# Patient Record
Sex: Female | Born: 1970
Health system: Southern US, Community
[De-identification: ages and names within clinical notes are randomized; demographics above are authoritative.]

## PROBLEM LIST (undated history)

## (undated) DIAGNOSIS — Z8781 Personal history of (healed) traumatic fracture: Secondary | ICD-10-CM

## (undated) DIAGNOSIS — K219 Gastro-esophageal reflux disease without esophagitis: Secondary | ICD-10-CM

## (undated) DIAGNOSIS — E039 Hypothyroidism, unspecified: Secondary | ICD-10-CM

## (undated) DIAGNOSIS — Z8619 Personal history of other infectious and parasitic diseases: Secondary | ICD-10-CM

## (undated) DIAGNOSIS — M169 Osteoarthritis of hip, unspecified: Secondary | ICD-10-CM

## (undated) HISTORY — PX: CHOLECYSTECTOMY: SHX55

---

## 2007-09-04 HISTORY — PX: KNEE ARTHROSCOPY: SUR90

## 2014-09-03 HISTORY — PX: LAPAROSCOPIC GASTRIC SLEEVE RESECTION: SHX5895

## 2016-07-05 DIAGNOSIS — M25551 Pain in right hip: Secondary | ICD-10-CM | POA: Diagnosis not present

## 2016-07-05 DIAGNOSIS — M1611 Unilateral primary osteoarthritis, right hip: Secondary | ICD-10-CM | POA: Diagnosis not present

## 2016-07-12 DIAGNOSIS — M25551 Pain in right hip: Secondary | ICD-10-CM | POA: Diagnosis not present

## 2016-08-01 DIAGNOSIS — M169 Osteoarthritis of hip, unspecified: Secondary | ICD-10-CM | POA: Diagnosis not present

## 2016-08-01 DIAGNOSIS — Z903 Acquired absence of stomach [part of]: Secondary | ICD-10-CM | POA: Diagnosis not present

## 2016-08-01 DIAGNOSIS — Z713 Dietary counseling and surveillance: Secondary | ICD-10-CM | POA: Diagnosis not present

## 2016-08-01 DIAGNOSIS — Z6834 Body mass index (BMI) 34.0-34.9, adult: Secondary | ICD-10-CM | POA: Diagnosis not present

## 2016-08-01 DIAGNOSIS — E669 Obesity, unspecified: Secondary | ICD-10-CM | POA: Diagnosis not present

## 2016-08-16 DIAGNOSIS — M1611 Unilateral primary osteoarthritis, right hip: Secondary | ICD-10-CM | POA: Diagnosis not present

## 2016-08-16 DIAGNOSIS — M25551 Pain in right hip: Secondary | ICD-10-CM | POA: Diagnosis not present

## 2016-10-17 DIAGNOSIS — M1611 Unilateral primary osteoarthritis, right hip: Secondary | ICD-10-CM | POA: Diagnosis not present

## 2016-10-24 DIAGNOSIS — E039 Hypothyroidism, unspecified: Secondary | ICD-10-CM | POA: Diagnosis not present

## 2017-01-02 DIAGNOSIS — L0292 Furuncle, unspecified: Secondary | ICD-10-CM | POA: Diagnosis not present

## 2017-01-02 DIAGNOSIS — Z01419 Encounter for gynecological examination (general) (routine) without abnormal findings: Secondary | ICD-10-CM | POA: Diagnosis not present

## 2017-01-02 DIAGNOSIS — Z1231 Encounter for screening mammogram for malignant neoplasm of breast: Secondary | ICD-10-CM | POA: Diagnosis not present

## 2017-01-02 DIAGNOSIS — Z6835 Body mass index (BMI) 35.0-35.9, adult: Secondary | ICD-10-CM | POA: Diagnosis not present

## 2017-01-08 ENCOUNTER — Other Ambulatory Visit: Payer: Self-pay | Admitting: Obstetrics and Gynecology

## 2017-01-08 DIAGNOSIS — R928 Other abnormal and inconclusive findings on diagnostic imaging of breast: Secondary | ICD-10-CM

## 2017-01-11 ENCOUNTER — Ambulatory Visit
Admission: RE | Admit: 2017-01-11 | Discharge: 2017-01-11 | Disposition: A | Discharge status: Home / Self Care | Payer: BLUE CROSS/BLUE SHIELD | Source: Ambulatory Visit | Attending: Obstetrics and Gynecology | Admitting: Obstetrics and Gynecology

## 2017-01-11 ENCOUNTER — Other Ambulatory Visit: Payer: Self-pay | Admitting: Obstetrics and Gynecology

## 2017-01-11 DIAGNOSIS — R928 Other abnormal and inconclusive findings on diagnostic imaging of breast: Secondary | ICD-10-CM | POA: Diagnosis not present

## 2017-01-11 DIAGNOSIS — R922 Inconclusive mammogram: Secondary | ICD-10-CM | POA: Diagnosis not present

## 2017-01-15 ENCOUNTER — Ambulatory Visit
Admission: RE | Admit: 2017-01-15 | Discharge: 2017-01-15 | Disposition: A | Discharge status: Home / Self Care | Payer: PRIVATE HEALTH INSURANCE | Source: Ambulatory Visit | Attending: Obstetrics and Gynecology | Admitting: Obstetrics and Gynecology

## 2017-01-15 ENCOUNTER — Other Ambulatory Visit: Payer: Self-pay | Admitting: Obstetrics and Gynecology

## 2017-01-15 DIAGNOSIS — R928 Other abnormal and inconclusive findings on diagnostic imaging of breast: Secondary | ICD-10-CM

## 2017-01-17 DIAGNOSIS — M1611 Unilateral primary osteoarthritis, right hip: Secondary | ICD-10-CM | POA: Diagnosis not present

## 2017-01-24 DIAGNOSIS — S73191D Other sprain of right hip, subsequent encounter: Secondary | ICD-10-CM | POA: Diagnosis not present

## 2017-01-24 DIAGNOSIS — M25551 Pain in right hip: Secondary | ICD-10-CM | POA: Diagnosis not present

## 2017-02-26 DIAGNOSIS — R05 Cough: Secondary | ICD-10-CM | POA: Diagnosis not present

## 2017-02-26 DIAGNOSIS — J0140 Acute pansinusitis, unspecified: Secondary | ICD-10-CM | POA: Diagnosis not present

## 2017-03-11 DIAGNOSIS — M1631 Unilateral osteoarthritis resulting from hip dysplasia, right hip: Secondary | ICD-10-CM | POA: Diagnosis not present

## 2017-04-25 DIAGNOSIS — M1611 Unilateral primary osteoarthritis, right hip: Secondary | ICD-10-CM | POA: Diagnosis not present

## 2017-06-13 DIAGNOSIS — M25551 Pain in right hip: Secondary | ICD-10-CM | POA: Diagnosis not present

## 2017-06-13 DIAGNOSIS — S73191D Other sprain of right hip, subsequent encounter: Secondary | ICD-10-CM | POA: Diagnosis not present

## 2017-06-13 DIAGNOSIS — M1611 Unilateral primary osteoarthritis, right hip: Secondary | ICD-10-CM | POA: Diagnosis not present

## 2017-07-10 DIAGNOSIS — Z23 Encounter for immunization: Secondary | ICD-10-CM | POA: Diagnosis not present

## 2017-07-10 DIAGNOSIS — Z0000 Encounter for general adult medical examination without abnormal findings: Secondary | ICD-10-CM | POA: Diagnosis not present

## 2017-07-10 DIAGNOSIS — E039 Hypothyroidism, unspecified: Secondary | ICD-10-CM | POA: Diagnosis not present

## 2017-07-10 DIAGNOSIS — M25551 Pain in right hip: Secondary | ICD-10-CM | POA: Diagnosis not present

## 2017-07-10 DIAGNOSIS — Z9884 Bariatric surgery status: Secondary | ICD-10-CM | POA: Diagnosis not present

## 2017-07-17 DIAGNOSIS — S73191D Other sprain of right hip, subsequent encounter: Secondary | ICD-10-CM | POA: Diagnosis not present

## 2017-07-17 DIAGNOSIS — M25551 Pain in right hip: Secondary | ICD-10-CM | POA: Diagnosis not present

## 2017-08-11 ENCOUNTER — Ambulatory Visit: Payer: Self-pay | Admitting: Orthopedic Surgery

## 2017-09-08 ENCOUNTER — Ambulatory Visit: Payer: Self-pay | Admitting: Orthopedic Surgery
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
## 2017-09-09 ENCOUNTER — Encounter (HOSPITAL_COMMUNITY): Payer: Self-pay

## 2017-09-09 NOTE — Pre-Procedure Instructions (Signed)
Surgical clearance and Last office visit note from Dr. Chales SalmonVaradarajan 07/10/17 in chart.

## 2017-09-09 NOTE — Patient Instructions (Signed)
Your procedure is scheduled on: Wednesday, Jan. 16, 2019   Surgery Time:  8:30AM-10:00AM   [facility]  [facility]    Report to admitting at 6:00 AM   Call this number if you have problems the morning of surgery [phone]   Do not eat food or drink liquids :After Midnight.   Do NOT smoke after Midnight   Take these medicines the morning of surgery with A SIP OF WATER: Esomeprazole, Levothyroxine                               You may not have any metal on your body including hair pins, jewelry, and body piercings             Do not wear make-up, lotions, powders, perfumes/cologne, or deodorant             Do not wear nail polish.  Do not shave  48 hours prior to surgery.      Do not bring valuables to the hospital. [facility] IS NOT             RESPONSIBLE   FOR VALUABLES.   Contacts, dentures or bridgework may not be worn into surgery.   Leave suitcase in the car. After surgery it may be brought to your room.   Special Instructions: Bring a copy of your healthcare power of attorney and living will documents         the day of surgery if you haven't scanned them in before.              Please read over the following fact sheets you were given:   [facility] - Preparing [facility] Before surgery, you can play an important role.  Because skin is not sterile, your skin needs to be as free of germs as possible.  You can reduce the number of germs on your skin by washing with CHG (chlorahexidine gluconate) soap before surgery.  CHG is an antiseptic cleaner which kills germs and bonds with the skin to continue killing germs even after washing. Please DO NOT use if you have an allergy to CHG or antibacterial soaps.  If your skin becomes reddened/irritated stop using the CHG and inform your nurse when you arrive at Short Stay. Do not shave (including legs and underarms) for at least 48 hours prior to the first CHG shower.  You may shave your  face/neck.  Please follow these instructions carefully:  1.  Shower with CHG Soap the night before surgery and the  morning of surgery.  2.  If you choose to wash your hair, wash your hair first as usual with your normal  shampoo.  3.  After you shampoo, rinse your hair and body thoroughly to remove the shampoo.                             4.  Use CHG as you would any other liquid soap.  You can apply chg directly to the skin and wash.  Gently with a scrungie or clean washcloth.  5.  Apply the CHG Soap to your body ONLY FROM THE NECK DOWN.   Do   not use on face/ open  Wound or open sores. Avoid contact with eyes, ears mouth and   genitals (private parts).                       Wash face,  Genitals (private parts) with your normal soap.             6.  Wash thoroughly, paying special attention to the area where your    surgery  will be performed.  7.  Thoroughly rinse your body with warm water from the neck down.  8.  DO NOT shower/wash with your normal soap after using and rinsing off the CHG Soap.                9.  Pat yourself dry with a clean towel.            10.  Wear clean pajamas.            11.  Place clean sheets on your bed the night of your first shower and do not  sleep with pets. Day of Surgery : Do not apply any lotions/deodorants the morning of surgery.  Please wear clean clothes to the hospital/surgery center.  FAILURE TO FOLLOW THESE INSTRUCTIONS MAY RESULT IN THE CANCELLATION OF YOUR SURGERY  PATIENT SIGNATURE_________________________________  NURSE SIGNATURE__________________________________  ________________________________________________________________________   Adam Phenix  An incentive spirometer is a tool that can help keep your lungs clear and active. This tool measures how well you are filling your lungs with each breath. Taking long deep breaths may help reverse or decrease the chance of developing breathing (pulmonary)  problems (especially infection) following:  A long period of time when you are unable to move or be active. BEFORE THE PROCEDURE   If the spirometer includes an indicator to show your best effort, your nurse or respiratory therapist will set it to a desired goal.  If possible, sit up straight or lean slightly forward. Try not to slouch.  Hold the incentive spirometer in an upright position. INSTRUCTIONS FOR USE  1. Sit on the edge of your bed if possible, or sit up as far as you can in bed or on a chair. 2. Hold the incentive spirometer in an upright position. 3. Breathe out normally. 4. Place the mouthpiece in your mouth and seal your lips tightly around it. 5. Breathe in slowly and as deeply as possible, raising the piston or the ball toward the top of the column. 6. Hold your breath for 3-5 seconds or for as long as possible. Allow the piston or ball to fall to the bottom of the column. 7. Remove the mouthpiece from your mouth and breathe out normally. 8. Rest for a few seconds and repeat Steps 1 through 7 at least 10 times every 1-2 hours when you are awake. Take your time and take a few normal breaths between deep breaths. 9. The spirometer may include an indicator to show your best effort. Use the indicator as a goal to work toward during each repetition. 10. After each set of 10 deep breaths, practice coughing to be sure your lungs are clear. If you have an incision (the cut made at the time of surgery), support your incision when coughing by placing a pillow or rolled up towels firmly against it. Once you are able to get out of bed, walk around indoors and cough well. You may stop using the incentive spirometer when instructed by your caregiver.  RISKS AND COMPLICATIONS  Take your time  so you do not get dizzy or light-headed.  If you are in pain, you may need to take or ask for pain medication before doing incentive spirometry. It is harder to take a deep breath if you are having  pain. AFTER USE  Rest and breathe slowly and easily.  It can be helpful to keep track of a log of your progress. Your caregiver can provide you with a simple table to help with this. If you are using the spirometer at home, follow these instructions: [place] IF:   You are having difficultly using the spirometer.  You have trouble using the spirometer as often as instructed.  Your pain medication is not giving enough relief while using the spirometer.  You develop fever of 100.5 F (38.1 C) or higher. SEEK IMMEDIATE MEDICAL CARE IF:   You cough up bloody sputum that had not been present before.  You develop fever of 102 F (38.9 C) or greater.  You develop worsening pain at or near the incision site. MAKE SURE YOU:   Understand these instructions.  Will watch your condition.  Will get help right away if you are not doing well or get worse. Document Released: 12/31/2006 Document Revised: 11/12/2011 Document Reviewed: 03/03/2007 ExitCare Patient Information 2014 ExitCare, [place].   ________________________________________________________________________  WHAT IS A BLOOD TRANSFUSION? Blood Transfusion Information  A transfusion is the replacement of blood or some of its parts. Blood is made up of multiple cells which provide different functions.  Red blood cells carry oxygen and are used for blood loss replacement.  White blood cells fight against infection.  Platelets control bleeding.  Plasma helps clot blood.  Other blood products are available for specialized needs, such as hemophilia or other clotting disorders. BEFORE THE TRANSFUSION  Who gives blood for transfusions?   Healthy volunteers who are fully evaluated to make sure their blood is safe. This is blood bank blood. Transfusion therapy is the safest it has ever been in the practice of medicine. Before blood is taken from a donor, a complete history is taken to make sure that person has no history  of diseases nor engages in risky social behavior (examples are intravenous drug use or sexual activity with multiple partners). The donor's travel history is screened to minimize risk of transmitting infections, such as malaria. The donated blood is tested for signs of infectious diseases, such as HIV and hepatitis. The blood is then tested to be sure it is compatible with you in order to minimize the chance of a transfusion reaction. If you or a relative donates blood, this is often done in anticipation of surgery and is not appropriate for emergency situations. It takes many days to process the donated blood. RISKS AND COMPLICATIONS Although transfusion therapy is very safe and saves many lives, the main dangers of transfusion include:   Getting an infectious disease.  Developing a transfusion reaction. This is an allergic reaction to something in the blood you were given. Every precaution is taken to prevent this. The decision to have a blood transfusion has been considered carefully by your caregiver before blood is given. Blood is not given unless the benefits outweigh the risks. AFTER THE TRANSFUSION  Right after receiving a blood transfusion, you will usually feel much better and more energetic. This is especially true if your red blood cells have gotten low (anemic). The transfusion raises the level of the red blood cells which carry oxygen, and this usually causes an energy increase.  The  nurse administering the transfusion will monitor you carefully for complications. HOME CARE INSTRUCTIONS  No special instructions are needed after a transfusion. You may find your energy is better. Speak with your caregiver about any limitations on activity for underlying diseases you may have. SEEK MEDICAL CARE IF:   Your condition is not improving after your transfusion.  You develop redness or irritation at the intravenous (IV) site. SEEK IMMEDIATE MEDICAL CARE IF:  Any of the following symptoms  occur over the next 12 hours:  Shaking chills.  You have a temperature by mouth above 102 F (38.9 C), not controlled by medicine.  Chest, back, or muscle pain.  People around you feel you are not acting correctly or are confused.  Shortness of breath or difficulty breathing.  Dizziness and fainting.  You get a rash or develop hives.  You have a decrease in urine output.  Your urine turns a dark color or changes to pink, red, or brown. Any of the following symptoms occur over the next 10 days:  You have a temperature by mouth above 102 F (38.9 C), not controlled by medicine.  Shortness of breath.  Weakness after normal activity.  The white part of the eye turns yellow (jaundice).  You have a decrease in the amount of urine or are urinating less often.  Your urine turns a dark color or changes to pink, red, or brown. Document Released: 08/17/2000 Document Revised: 11/12/2011 Document Reviewed: 04/05/2008 [facility] Patient Information 2014 [place], [place].  _______________________________________________________________________

## 2017-09-11 ENCOUNTER — Encounter (HOSPITAL_COMMUNITY): Payer: Self-pay

## 2017-09-11 ENCOUNTER — Other Ambulatory Visit: Payer: Self-pay

## 2017-09-11 ENCOUNTER — Encounter (INDEPENDENT_AMBULATORY_CARE_PROVIDER_SITE_OTHER): Payer: Self-pay

## 2017-09-11 ENCOUNTER — Encounter (HOSPITAL_COMMUNITY)
Admission: RE | Admit: 2017-09-11 | Discharge: 2017-09-11 | Disposition: A | Discharge status: Home / Self Care | Payer: BLUE CROSS/BLUE SHIELD | Source: Ambulatory Visit | Attending: Orthopedic Surgery | Admitting: Orthopedic Surgery

## 2017-09-11 DIAGNOSIS — Z01812 Encounter for preprocedural laboratory examination: Secondary | ICD-10-CM | POA: Diagnosis not present

## 2017-09-11 DIAGNOSIS — M1611 Unilateral primary osteoarthritis, right hip: Secondary | ICD-10-CM | POA: Insufficient documentation

## 2017-09-11 HISTORY — DX: Gastro-esophageal reflux disease without esophagitis: K21.9

## 2017-09-11 HISTORY — DX: Personal history of other infectious and parasitic diseases: Z86.19

## 2017-09-11 HISTORY — DX: Personal history of (healed) traumatic fracture: Z87.81

## 2017-09-11 HISTORY — DX: Osteoarthritis of hip, unspecified: M16.9

## 2017-09-11 HISTORY — DX: Hypothyroidism, unspecified: E03.9

## 2017-09-11 LAB — ABO/RH: ABO/RH(D): O POS

## 2017-09-11 LAB — PROTIME-INR
INR: 1.02
Prothrombin Time: 13.3 seconds (ref 11.4–15.2)

## 2017-09-11 LAB — COMPREHENSIVE METABOLIC PANEL
ALT: 21 U/L (ref 14–54)
AST: 21 U/L (ref 15–41)
Albumin: 4 g/dL (ref 3.5–5.0)
Alkaline Phosphatase: 54 U/L (ref 38–126)
Anion gap: 3 — ABNORMAL LOW (ref 5–15)
BUN: 27 mg/dL — AB (ref 6–20)
CHLORIDE: 106 mmol/L (ref 101–111)
CO2: 27 mmol/L (ref 22–32)
CREATININE: 0.68 mg/dL (ref 0.44–1.00)
Calcium: 8.7 mg/dL — ABNORMAL LOW (ref 8.9–10.3)
GFR calc Af Amer: >60 mL/min (ref >60)
Glucose, Bld: 88 mg/dL (ref 65–99)
Potassium: 4.2 mmol/L (ref 3.5–5.1)
Sodium: 136 mmol/L (ref 135–145)
Total Bilirubin: 0.9 mg/dL (ref 0.3–1.2)
Total Protein: 6.8 g/dL (ref 6.5–8.1)

## 2017-09-11 LAB — CBC
HEMATOCRIT: 39 % (ref 36.0–46.0)
Hemoglobin: 12.9 g/dL (ref 12.0–15.0)
MCH: 31.5 pg (ref 26.0–34.0)
MCHC: 33.1 g/dL (ref 30.0–36.0)
MCV: 95.1 fL (ref 78.0–100.0)
Platelets: 179 10*3/uL (ref 150–400)
RBC: 4.1 MIL/uL (ref 3.87–5.11)
RDW: 12.4 % (ref 11.5–15.5)
WBC: 6.5 10*3/uL (ref 4.0–10.5)

## 2017-09-11 LAB — SURGICAL PCR SCREEN
MRSA, PCR: NEGATIVE
STAPHYLOCOCCUS AUREUS: NEGATIVE

## 2017-09-11 LAB — APTT: aPTT: 31 seconds (ref 24–36)

## 2017-09-12 NOTE — Pre-Procedure Instructions (Signed)
CMP results 09/11/17 faxed to Dr. Aluisio via epic. 

## 2017-09-17 MED ORDER — SODIUM CHLORIDE 0.9 % IV SOLN
1000.0000 mg | INTRAVENOUS | Status: AC
  Administered 2017-09-18: 1000 mg via INTRAVENOUS
  Filled 2017-09-17: qty 1100

## 2017-09-17 NOTE — Anesthesia Preprocedure Evaluation (Signed)
Anesthesia Evaluation  Patient identified by MRN, date of birth, ID band Patient awake    Reviewed: Allergy & Precautions, NPO status , Patient's Chart, lab work & pertinent test results  Airway Mallampati: II  TM Distance: >3 FB Neck ROM: Full    Dental no notable dental hx.    Pulmonary neg pulmonary ROS,    Pulmonary exam normal breath sounds clear to auscultation       Cardiovascular negative cardio ROS Normal cardiovascular exam Rhythm:Regular Rate:Normal     Neuro/Psych negative neurological ROS  negative psych ROS   GI/Hepatic negative GI ROS, Neg liver ROS, GERD  ,  Endo/Other  negative endocrine ROSHypothyroidism   Renal/GU negative Renal ROS  negative genitourinary   Musculoskeletal negative musculoskeletal ROS (+) Arthritis , Osteoarthritis,    Abdominal   Peds negative pediatric ROS (+)  Hematology negative hematology ROS (+)   Anesthesia Other Findings   Reproductive/Obstetrics negative OB ROS                             Anesthesia Physical Anesthesia Plan  ASA: II  Anesthesia Plan: Spinal   Post-op Pain Management:    Induction:   PONV Risk Score and Plan: 2 and Propofol infusion, Ondansetron and Treatment may vary due to age or medical condition  Airway Management Planned: Nasal Cannula and Natural Airway  Additional Equipment:   Intra-op Plan:   Post-operative Plan:   Informed Consent: I have reviewed the patients History and Physical, chart, labs and discussed the procedure including the risks, benefits and alternatives for the proposed anesthesia with the patient or authorized representative who has indicated his/her understanding and acceptance.     Plan Discussed with: CRNA and Anesthesiologist  Anesthesia Plan Comments: (  )        Anesthesia Quick Evaluation

## 2017-09-18 ENCOUNTER — Inpatient Hospital Stay (HOSPITAL_COMMUNITY)
Admission: RE | Admit: 2017-09-18 | Discharge: 2017-09-20 | DRG: 470 | Disposition: A | Discharge status: Home Health | Payer: BLUE CROSS/BLUE SHIELD | Source: Ambulatory Visit | Attending: Orthopedic Surgery | Admitting: Orthopedic Surgery

## 2017-09-18 ENCOUNTER — Inpatient Hospital Stay (HOSPITAL_COMMUNITY): Payer: BLUE CROSS/BLUE SHIELD

## 2017-09-18 ENCOUNTER — Inpatient Hospital Stay (HOSPITAL_COMMUNITY): Payer: BLUE CROSS/BLUE SHIELD | Admitting: Anesthesiology

## 2017-09-18 ENCOUNTER — Encounter (HOSPITAL_COMMUNITY)
Admission: RE | Disposition: A | Discharge status: Home Health | Payer: Self-pay | Source: Ambulatory Visit | Attending: Orthopedic Surgery

## 2017-09-18 ENCOUNTER — Other Ambulatory Visit: Payer: Self-pay

## 2017-09-18 ENCOUNTER — Encounter (HOSPITAL_COMMUNITY): Payer: Self-pay | Admitting: Emergency Medicine

## 2017-09-18 DIAGNOSIS — M1611 Unilateral primary osteoarthritis, right hip: Secondary | ICD-10-CM | POA: Diagnosis not present

## 2017-09-18 DIAGNOSIS — Z79890 Hormone replacement therapy (postmenopausal): Secondary | ICD-10-CM

## 2017-09-18 DIAGNOSIS — M25751 Osteophyte, right hip: Secondary | ICD-10-CM | POA: Diagnosis present

## 2017-09-18 DIAGNOSIS — Z96649 Presence of unspecified artificial hip joint: Secondary | ICD-10-CM

## 2017-09-18 DIAGNOSIS — Z79899 Other long term (current) drug therapy: Secondary | ICD-10-CM

## 2017-09-18 DIAGNOSIS — E669 Obesity, unspecified: Secondary | ICD-10-CM | POA: Diagnosis present

## 2017-09-18 DIAGNOSIS — Z820 Family history of epilepsy and other diseases of the nervous system: Secondary | ICD-10-CM | POA: Diagnosis not present

## 2017-09-18 DIAGNOSIS — Z808 Family history of malignant neoplasm of other organs or systems: Secondary | ICD-10-CM | POA: Diagnosis not present

## 2017-09-18 DIAGNOSIS — Z96641 Presence of right artificial hip joint: Secondary | ICD-10-CM | POA: Diagnosis not present

## 2017-09-18 DIAGNOSIS — Z87891 Personal history of nicotine dependence: Secondary | ICD-10-CM

## 2017-09-18 DIAGNOSIS — E039 Hypothyroidism, unspecified: Secondary | ICD-10-CM | POA: Diagnosis present

## 2017-09-18 DIAGNOSIS — Z9884 Bariatric surgery status: Secondary | ICD-10-CM | POA: Diagnosis not present

## 2017-09-18 DIAGNOSIS — Z791 Long term (current) use of non-steroidal anti-inflammatories (NSAID): Secondary | ICD-10-CM | POA: Diagnosis not present

## 2017-09-18 DIAGNOSIS — K219 Gastro-esophageal reflux disease without esophagitis: Secondary | ICD-10-CM | POA: Diagnosis present

## 2017-09-18 DIAGNOSIS — M169 Osteoarthritis of hip, unspecified: Secondary | ICD-10-CM | POA: Diagnosis present

## 2017-09-18 DIAGNOSIS — Z9049 Acquired absence of other specified parts of digestive tract: Secondary | ICD-10-CM

## 2017-09-18 DIAGNOSIS — Z6836 Body mass index (BMI) 36.0-36.9, adult: Secondary | ICD-10-CM | POA: Diagnosis not present

## 2017-09-18 DIAGNOSIS — Z471 Aftercare following joint replacement surgery: Secondary | ICD-10-CM | POA: Diagnosis not present

## 2017-09-18 HISTORY — PX: TOTAL HIP ARTHROPLASTY: SHX124

## 2017-09-18 LAB — TYPE AND SCREEN
ABO/RH(D): O POS
Antibody Screen: NEGATIVE

## 2017-09-18 SURGERY — ARTHROPLASTY, HIP, TOTAL, ANTERIOR APPROACH
Anesthesia: Spinal | Site: Hip | Laterality: Right

## 2017-09-18 MED ORDER — ACETAMINOPHEN 325 MG PO TABS: 325.0000 mg | ORAL_TABLET | ORAL | Status: DC | PRN

## 2017-09-18 MED ORDER — MENTHOL 3 MG MT LOZG: 1.0000 | LOZENGE | OROMUCOSAL | Status: DC | PRN

## 2017-09-18 MED ORDER — CHLORHEXIDINE GLUCONATE 4 % EX LIQD: 60.0000 mL | Freq: Once | CUTANEOUS | Status: DC

## 2017-09-18 MED ORDER — LEVOTHYROXINE SODIUM 25 MCG PO TABS
25.0000 ug | ORAL_TABLET | Freq: Every day | ORAL | Status: DC
  Administered 2017-09-19 – 2017-09-20 (×3): 25 ug via ORAL
  Filled 2017-09-18 (×2): qty 1

## 2017-09-18 MED ORDER — TRANEXAMIC ACID 1000 MG/10ML IV SOLN
1000.0000 mg | Freq: Once | INTRAVENOUS | Status: AC
  Administered 2017-09-18: 1000 mg via INTRAVENOUS
  Filled 2017-09-18: qty 1100

## 2017-09-18 MED ORDER — FLEET ENEMA 7-19 GM/118ML RE ENEM: 1.0000 | ENEMA | Freq: Once | RECTAL | Status: DC | PRN

## 2017-09-18 MED ORDER — CEFAZOLIN SODIUM-DEXTROSE 2-4 GM/100ML-% IV SOLN
2.0000 g | INTRAVENOUS | Status: AC
  Administered 2017-09-18: 2 g via INTRAVENOUS
  Filled 2017-09-18: qty 100

## 2017-09-18 MED ORDER — METHOCARBAMOL 500 MG PO TABS
500.0000 mg | ORAL_TABLET | Freq: Four times a day (QID) | ORAL | Status: DC | PRN
  Administered 2017-09-19 – 2017-09-20 (×3): 500 mg via ORAL
  Filled 2017-09-18 (×3): qty 1

## 2017-09-18 MED ORDER — MEPERIDINE HCL 50 MG/ML IJ SOLN: 6.2500 mg | INTRAMUSCULAR | Status: DC | PRN

## 2017-09-18 MED ORDER — DEXAMETHASONE SODIUM PHOSPHATE 10 MG/ML IJ SOLN
INTRAMUSCULAR | Status: AC
  Filled 2017-09-18: qty 1

## 2017-09-18 MED ORDER — 0.9 % SODIUM CHLORIDE (POUR BTL) OPTIME
TOPICAL | Status: DC | PRN
  Administered 2017-09-18: 1000 mL

## 2017-09-18 MED ORDER — OXYCODONE HCL 5 MG PO TABS: 5.0000 mg | ORAL_TABLET | Freq: Once | ORAL | Status: DC | PRN

## 2017-09-18 MED ORDER — TRAMADOL HCL 50 MG PO TABS: 50.0000 mg | ORAL_TABLET | Freq: Four times a day (QID) | ORAL | Status: DC | PRN

## 2017-09-18 MED ORDER — GLYCOPYRROLATE 0.2 MG/ML IV SOSY
PREFILLED_SYRINGE | INTRAVENOUS | Status: AC
  Filled 2017-09-18: qty 5

## 2017-09-18 MED ORDER — EPHEDRINE SULFATE-NACL 50-0.9 MG/10ML-% IV SOSY
PREFILLED_SYRINGE | INTRAVENOUS | Status: DC | PRN
  Administered 2017-09-18: 10 mg via INTRAVENOUS
  Administered 2017-09-18: 20 mg via INTRAVENOUS
  Administered 2017-09-18: 10 mg via INTRAVENOUS
  Administered 2017-09-18: 5 mg via INTRAVENOUS

## 2017-09-18 MED ORDER — METHOCARBAMOL 1000 MG/10ML IJ SOLN
500.0000 mg | Freq: Four times a day (QID) | INTRAVENOUS | Status: DC | PRN
  Administered 2017-09-18 (×2): 500 mg via INTRAVENOUS
  Filled 2017-09-18 (×2): qty 550

## 2017-09-18 MED ORDER — ACETAMINOPHEN 160 MG/5ML PO SOLN: 325.0000 mg | ORAL | Status: DC | PRN

## 2017-09-18 MED ORDER — PROPOFOL 500 MG/50ML IV EMUL
INTRAVENOUS | Status: DC | PRN
  Administered 2017-09-18: 75 ug/kg/min via INTRAVENOUS

## 2017-09-18 MED ORDER — DEXAMETHASONE SODIUM PHOSPHATE 10 MG/ML IJ SOLN
10.0000 mg | Freq: Once | INTRAMUSCULAR | Status: AC
  Administered 2017-09-19: 10 mg via INTRAVENOUS
  Filled 2017-09-18: qty 1

## 2017-09-18 MED ORDER — EPHEDRINE 5 MG/ML INJ
INTRAVENOUS | Status: AC
  Filled 2017-09-18: qty 10

## 2017-09-18 MED ORDER — MIDAZOLAM HCL 5 MG/5ML IJ SOLN
INTRAMUSCULAR | Status: DC | PRN
  Administered 2017-09-18: 2 mg via INTRAVENOUS

## 2017-09-18 MED ORDER — CEFAZOLIN SODIUM-DEXTROSE 2-4 GM/100ML-% IV SOLN
2.0000 g | Freq: Four times a day (QID) | INTRAVENOUS | Status: AC
  Administered 2017-09-18 (×2): 2 g via INTRAVENOUS
  Filled 2017-09-18 (×2): qty 100

## 2017-09-18 MED ORDER — PROPOFOL 10 MG/ML IV BOLUS
INTRAVENOUS | Status: DC | PRN
  Administered 2017-09-18: 20 mg via INTRAVENOUS
  Administered 2017-09-18: 10 mg via INTRAVENOUS
  Administered 2017-09-18: 20 mg via INTRAVENOUS

## 2017-09-18 MED ORDER — OXYCODONE HCL 5 MG PO TABS: 5.0000 mg | ORAL_TABLET | ORAL | Status: DC | PRN

## 2017-09-18 MED ORDER — STERILE WATER FOR IRRIGATION IR SOLN
Status: DC | PRN
  Administered 2017-09-18: 2000 mL

## 2017-09-18 MED ORDER — PROPOFOL 10 MG/ML IV BOLUS
INTRAVENOUS | Status: AC
  Filled 2017-09-18: qty 20

## 2017-09-18 MED ORDER — ONDANSETRON HCL 4 MG/2ML IJ SOLN
INTRAMUSCULAR | Status: DC | PRN
  Administered 2017-09-18: 4 mg via INTRAVENOUS

## 2017-09-18 MED ORDER — POLYETHYLENE GLYCOL 3350 17 G PO PACK
17.0000 g | PACK | Freq: Every day | ORAL | Status: DC | PRN
  Administered 2017-09-18 – 2017-09-19 (×2): 17 g via ORAL
  Filled 2017-09-18 (×2): qty 1

## 2017-09-18 MED ORDER — ACETAMINOPHEN 10 MG/ML IV SOLN
1000.0000 mg | Freq: Once | INTRAVENOUS | Status: AC
  Administered 2017-09-18: 1000 mg via INTRAVENOUS
  Filled 2017-09-18: qty 100

## 2017-09-18 MED ORDER — KETOROLAC TROMETHAMINE 30 MG/ML IJ SOLN: 30.0000 mg | Freq: Once | INTRAMUSCULAR | Status: DC | PRN

## 2017-09-18 MED ORDER — PROPOFOL 10 MG/ML IV BOLUS
INTRAVENOUS | Status: AC
  Filled 2017-09-18: qty 60

## 2017-09-18 MED ORDER — MIDAZOLAM HCL 2 MG/2ML IJ SOLN
INTRAMUSCULAR | Status: AC
  Filled 2017-09-18: qty 2

## 2017-09-18 MED ORDER — RIVAROXABAN 10 MG PO TABS
10.0000 mg | ORAL_TABLET | Freq: Every day | ORAL | Status: DC
  Administered 2017-09-19 – 2017-09-20 (×2): 10 mg via ORAL
  Filled 2017-09-18 (×2): qty 1

## 2017-09-18 MED ORDER — PHENOL 1.4 % MT LIQD: 1.0000 | OROMUCOSAL | Status: DC | PRN

## 2017-09-18 MED ORDER — PANTOPRAZOLE SODIUM 40 MG PO TBEC
80.0000 mg | DELAYED_RELEASE_TABLET | Freq: Every day | ORAL | Status: DC
  Administered 2017-09-19 – 2017-09-20 (×2): 80 mg via ORAL
  Filled 2017-09-18 (×2): qty 2

## 2017-09-18 MED ORDER — OXYCODONE HCL 5 MG PO TABS
10.0000 mg | ORAL_TABLET | ORAL | Status: DC | PRN
  Administered 2017-09-18 – 2017-09-20 (×12): 10 mg via ORAL
  Filled 2017-09-18 (×12): qty 2

## 2017-09-18 MED ORDER — DOCUSATE SODIUM 100 MG PO CAPS
100.0000 mg | ORAL_CAPSULE | Freq: Two times a day (BID) | ORAL | Status: DC
  Administered 2017-09-18 – 2017-09-20 (×4): 100 mg via ORAL
  Filled 2017-09-18 (×4): qty 1

## 2017-09-18 MED ORDER — DM-GUAIFENESIN ER 30-600 MG PO TB12: 1.0000 | ORAL_TABLET | Freq: Two times a day (BID) | ORAL | Status: DC | PRN

## 2017-09-18 MED ORDER — BUPIVACAINE HCL (PF) 0.25 % IJ SOLN
INTRAMUSCULAR | Status: DC | PRN
  Administered 2017-09-18: 30 mL

## 2017-09-18 MED ORDER — DEXAMETHASONE SODIUM PHOSPHATE 10 MG/ML IJ SOLN
10.0000 mg | Freq: Once | INTRAMUSCULAR | Status: AC
  Administered 2017-09-18: 10 mg via INTRAVENOUS

## 2017-09-18 MED ORDER — DIPHENHYDRAMINE HCL 12.5 MG/5ML PO ELIX: 12.5000 mg | ORAL_SOLUTION | ORAL | Status: DC | PRN

## 2017-09-18 MED ORDER — ACETAMINOPHEN 500 MG PO TABS
1000.0000 mg | ORAL_TABLET | Freq: Four times a day (QID) | ORAL | Status: AC
  Administered 2017-09-18 – 2017-09-19 (×4): 1000 mg via ORAL
  Filled 2017-09-18 (×4): qty 2

## 2017-09-18 MED ORDER — FENTANYL CITRATE (PF) 100 MCG/2ML IJ SOLN
INTRAMUSCULAR | Status: DC | PRN
  Administered 2017-09-18 (×2): 50 ug via INTRAVENOUS

## 2017-09-18 MED ORDER — FENTANYL CITRATE (PF) 100 MCG/2ML IJ SOLN
INTRAMUSCULAR | Status: AC
  Administered 2017-09-18: 25 ug via INTRAVENOUS
  Filled 2017-09-18: qty 2

## 2017-09-18 MED ORDER — LACTATED RINGERS IV SOLN
INTRAVENOUS | Status: DC
  Administered 2017-09-18 (×3): via INTRAVENOUS

## 2017-09-18 MED ORDER — ONDANSETRON HCL 4 MG/2ML IJ SOLN
4.0000 mg | Freq: Four times a day (QID) | INTRAMUSCULAR | Status: DC | PRN
  Administered 2017-09-19: 4 mg via INTRAVENOUS
  Filled 2017-09-18: qty 2

## 2017-09-18 MED ORDER — SODIUM CHLORIDE 0.9 % IV SOLN
INTRAVENOUS | Status: DC
  Administered 2017-09-18: 18:00:00 via INTRAVENOUS

## 2017-09-18 MED ORDER — FENTANYL CITRATE (PF) 100 MCG/2ML IJ SOLN
INTRAMUSCULAR | Status: AC
  Filled 2017-09-18: qty 2

## 2017-09-18 MED ORDER — FENTANYL CITRATE (PF) 100 MCG/2ML IJ SOLN
25.0000 ug | INTRAMUSCULAR | Status: DC | PRN
  Administered 2017-09-18 (×2): 25 ug via INTRAVENOUS
  Administered 2017-09-18: 50 ug via INTRAVENOUS

## 2017-09-18 MED ORDER — ACETAMINOPHEN 325 MG PO TABS: 650.0000 mg | ORAL_TABLET | ORAL | Status: DC | PRN

## 2017-09-18 MED ORDER — PHENYLEPHRINE HCL 10 MG/ML IJ SOLN
INTRAMUSCULAR | Status: AC
  Filled 2017-09-18: qty 1

## 2017-09-18 MED ORDER — BISACODYL 10 MG RE SUPP: 10.0000 mg | Freq: Every day | RECTAL | Status: DC | PRN

## 2017-09-18 MED ORDER — BUPIVACAINE HCL (PF) 0.25 % IJ SOLN
INTRAMUSCULAR | Status: AC
  Filled 2017-09-18: qty 30

## 2017-09-18 MED ORDER — METOCLOPRAMIDE HCL 5 MG/ML IJ SOLN
5.0000 mg | Freq: Three times a day (TID) | INTRAMUSCULAR | Status: DC | PRN
  Administered 2017-09-19: 10 mg via INTRAVENOUS
  Filled 2017-09-18: qty 2

## 2017-09-18 MED ORDER — GLYCOPYRROLATE 0.2 MG/ML IV SOSY
PREFILLED_SYRINGE | INTRAVENOUS | Status: DC | PRN
  Administered 2017-09-18: .2 mg via INTRAVENOUS

## 2017-09-18 MED ORDER — ONDANSETRON HCL 4 MG/2ML IJ SOLN
INTRAMUSCULAR | Status: AC
  Filled 2017-09-18: qty 2

## 2017-09-18 MED ORDER — OXYCODONE HCL 5 MG/5ML PO SOLN
5.0000 mg | Freq: Once | ORAL | Status: DC | PRN
  Filled 2017-09-18: qty 5

## 2017-09-18 MED ORDER — MORPHINE SULFATE (PF) 2 MG/ML IV SOLN
1.0000 mg | INTRAVENOUS | Status: DC | PRN
  Administered 2017-09-18 (×2): 1 mg via INTRAVENOUS
  Filled 2017-09-18 (×2): qty 1

## 2017-09-18 MED ORDER — PHENYLEPHRINE 40 MCG/ML (10ML) SYRINGE FOR IV PUSH (FOR BLOOD PRESSURE SUPPORT)
PREFILLED_SYRINGE | INTRAVENOUS | Status: AC
  Filled 2017-09-18: qty 10

## 2017-09-18 MED ORDER — PHENYLEPHRINE 40 MCG/ML (10ML) SYRINGE FOR IV PUSH (FOR BLOOD PRESSURE SUPPORT)
PREFILLED_SYRINGE | INTRAVENOUS | Status: DC | PRN
  Administered 2017-09-18: 80 ug via INTRAVENOUS

## 2017-09-18 MED ORDER — ONDANSETRON HCL 4 MG PO TABS
4.0000 mg | ORAL_TABLET | Freq: Four times a day (QID) | ORAL | Status: DC | PRN
  Administered 2017-09-20: 4 mg via ORAL
  Filled 2017-09-18: qty 1

## 2017-09-18 MED ORDER — PHENYLEPHRINE HCL 10 MG/ML IJ SOLN
INTRAVENOUS | Status: DC | PRN
  Administered 2017-09-18: 20 ug/min via INTRAVENOUS

## 2017-09-18 MED ORDER — ACETAMINOPHEN 650 MG RE SUPP: 650.0000 mg | RECTAL | Status: DC | PRN

## 2017-09-18 MED ORDER — BUPIVACAINE IN DEXTROSE 0.75-8.25 % IT SOLN
INTRATHECAL | Status: DC | PRN
  Administered 2017-09-18: 2 mL via INTRATHECAL

## 2017-09-18 MED ORDER — METOCLOPRAMIDE HCL 5 MG PO TABS: 5.0000 mg | ORAL_TABLET | Freq: Three times a day (TID) | ORAL | Status: DC | PRN

## 2017-09-18 MED ORDER — ONDANSETRON HCL 4 MG/2ML IJ SOLN: 4.0000 mg | Freq: Once | INTRAMUSCULAR | Status: DC | PRN

## 2017-09-18 SURGICAL SUPPLY — 36 items
BAG DECANTER FOR FLEXI CONT (MISCELLANEOUS) IMPLANT
BAG ZIPLOCK 12X15 (MISCELLANEOUS) IMPLANT
BLADE SAG 18X100X1.27 (BLADE) ×2 IMPLANT
CAPT HIP TOTAL 2 ×2 IMPLANT
CLOTH BEACON ORANGE TIMEOUT ST (SAFETY) ×2 IMPLANT
COVER PERINEAL POST (MISCELLANEOUS) ×2 IMPLANT
COVER SURGICAL LIGHT HANDLE (MISCELLANEOUS) ×2 IMPLANT
DECANTER SPIKE VIAL GLASS SM (MISCELLANEOUS) ×2 IMPLANT
DRAPE STERI IOBAN 125X83 (DRAPES) ×2 IMPLANT
DRAPE U-SHAPE 47X51 STRL (DRAPES) ×4 IMPLANT
DRSG ADAPTIC 3X8 NADH LF (GAUZE/BANDAGES/DRESSINGS) ×2 IMPLANT
DRSG EMULSION OIL 3X16 NADH (GAUZE/BANDAGES/DRESSINGS) ×2 IMPLANT
DRSG MEPILEX BORDER 4X4 (GAUZE/BANDAGES/DRESSINGS) ×2 IMPLANT
DRSG MEPILEX BORDER 4X8 (GAUZE/BANDAGES/DRESSINGS) ×2 IMPLANT
DURAPREP 26ML APPLICATOR (WOUND CARE) ×2 IMPLANT
ELECT REM PT RETURN 15FT ADLT (MISCELLANEOUS) ×2 IMPLANT
EVACUATOR 1/8 PVC DRAIN (DRAIN) ×2 IMPLANT
GLOVE BIO SURGEON STRL SZ7.5 (GLOVE) ×2 IMPLANT
GLOVE BIO SURGEON STRL SZ8 (GLOVE) ×4 IMPLANT
GLOVE BIOGEL PI IND STRL 8 (GLOVE) ×2 IMPLANT
GLOVE BIOGEL PI INDICATOR 8 (GLOVE) ×2
GOWN STRL REUS W/TWL LRG LVL3 (GOWN DISPOSABLE) ×2 IMPLANT
GOWN STRL REUS W/TWL XL LVL3 (GOWN DISPOSABLE) ×2 IMPLANT
HOLDER FOLEY CATH W/STRAP (MISCELLANEOUS) ×2 IMPLANT
PACK ANTERIOR HIP CUSTOM (KITS) ×2 IMPLANT
STRIP CLOSURE SKIN 1/2X4 (GAUZE/BANDAGES/DRESSINGS) ×2 IMPLANT
SUT ETHIBOND NAB CT1 #1 30IN (SUTURE) ×2 IMPLANT
SUT MNCRL AB 4-0 PS2 18 (SUTURE) ×2 IMPLANT
SUT STRATAFIX 0 PDS 27 VIOLET (SUTURE) ×4
SUT VIC AB 2-0 CT1 27 (SUTURE) ×2
SUT VIC AB 2-0 CT1 TAPERPNT 27 (SUTURE) ×2 IMPLANT
SUTURE STRATFX 0 PDS 27 VIOLET (SUTURE) ×2 IMPLANT
SYR 50ML LL SCALE MARK (SYRINGE) IMPLANT
TRAY FOLEY CATH 14FRSI W/METER (CATHETERS) ×2 IMPLANT
TRAY FOLEY W/METER SILVER 16FR (SET/KITS/TRAYS/PACK) IMPLANT
YANKAUER SUCT BULB TIP 10FT TU (MISCELLANEOUS) ×2 IMPLANT

## 2017-09-18 NOTE — Discharge Instructions (Addendum)
° °Dr. Frank Aluisio °Total Joint Specialist °[facility] °[address] °[place], [place] [address] °[phone] ° °ANTERIOR APPROACH TOTAL HIP REPLACEMENT POSTOPERATIVE DIRECTIONS ° ° °Hip Rehabilitation, Guidelines Following Surgery  °The results of a hip operation are greatly improved after range of motion and muscle strengthening exercises. Follow all safety measures which are given to protect your hip. If any of these exercises cause increased pain or swelling in your joint, decrease the amount until you are comfortable again. Then slowly increase the exercises. Call your caregiver if you have problems or questions.  ° °HOME CARE INSTRUCTIONS  °Remove items at home which could result in a fall. This includes throw rugs or furniture in walking pathways.  °· ICE to the affected hip every three hours for 30 minutes at a time and then as needed for pain and swelling.  Continue to use ice on the hip for pain and swelling from surgery. You may notice swelling that will progress down to the foot and ankle.  This is normal after surgery.  Elevate the leg when you are not up walking on it.   °· Continue to use the breathing machine which will help keep your temperature down.  It is common for your temperature to cycle up and down following surgery, especially at night when you are not up moving around and exerting yourself.  The breathing machine keeps your lungs expanded and your temperature down. ° ° °DIET °You may resume your previous home diet once your are discharged from the hospital. ° °DRESSING / WOUND CARE / SHOWERING °You may shower 3 days after surgery, but keep the wounds dry during showering.  You may use an occlusive plastic wrap (Press'n Seal for example), NO SOAKING/SUBMERGING IN THE BATHTUB.  If the bandage gets wet, change with a clean dry gauze.  If the incision gets wet, pat the wound dry with a clean towel. °You may start showering once you are discharged home but do not  submerge the incision under water. Just pat the incision dry and apply a dry gauze dressing on daily. °Change the surgical dressing daily and reapply a dry dressing each time. ° °ACTIVITY °Walk with your walker as instructed. °Use walker as long as suggested by your caregivers. °Avoid periods of inactivity such as sitting longer than an hour when not asleep. This helps prevent blood clots.  °You may resume a sexual relationship in one month or when given the OK by your doctor.  °You may return to work once you are cleared by your doctor.  °Do not drive a car for 6 weeks or until released by you surgeon.  °Do not drive while taking narcotics. ° °WEIGHT BEARING °Weight bearing as tolerated with assist device (walker, cane, etc) as directed, use it as long as suggested by your surgeon or therapist, typically at least 4-6 weeks. ° °POSTOPERATIVE CONSTIPATION PROTOCOL °Constipation - defined medically as fewer than three stools per week and severe constipation as less than one stool per week. ° °One of the most common issues patients have following surgery is constipation.  Even if you have a regular bowel pattern at home, your normal regimen is likely to be disrupted due to multiple reasons following surgery.  Combination of anesthesia, postoperative narcotics, change in appetite and fluid intake all can affect your bowels.  In order to avoid complications following surgery, here are some recommendations in order to help you during your recovery period. ° °Colace (docusate) - Pick up an over-the-counter   form of Colace or another stool softener and take twice a day as long as you are requiring postoperative pain medications.  Take with a full glass of water daily.  If you experience loose stools or diarrhea, hold the colace until you stool forms back up.  If your symptoms do not get better within 1 week or if they get worse, check with your doctor. ° °Dulcolax (bisacodyl) - Pick up over-the-counter and take as directed  by the product packaging as needed to assist with the movement of your bowels.  Take with a full glass of water.  Use this product as needed if not relieved by Colace only.  ° °MiraLax (polyethylene glycol) - Pick up over-the-counter to have on hand.  MiraLax is a solution that will increase the amount of water in your bowels to assist with bowel movements.  Take as directed and can mix with a glass of water, juice, soda, coffee, or tea.  Take if you go more than two days without a movement. °Do not use MiraLax more than once per day. Call your doctor if you are still constipated or irregular after using this medication for 7 days in a row. ° °If you continue to have problems with postoperative constipation, please contact the office for further assistance and recommendations.  If you experience "the worst abdominal pain ever" or develop nausea or vomiting, please contact the office immediatly for further recommendations for treatment. ° °ITCHING ° If you experience itching with your medications, try taking only a single pain pill, or even half a pain pill at a time.  You can also use Benadryl over the counter for itching or also to help with sleep.  ° °TED HOSE STOCKINGS °Wear the elastic stockings on both legs for three weeks following surgery during the day but you may remove then at night for sleeping. ° °MEDICATIONS °See your medication summary on the “After Visit Summary” that the nursing staff will review with you prior to discharge.  You may have some home medications which will be placed on hold until you complete the course of blood thinner medication.  It is important for you to complete the blood thinner medication as prescribed by your surgeon.  Continue your approved medications as instructed at time of discharge. ° °PRECAUTIONS °If you experience chest pain or shortness of breath - call 911 immediately for transfer to the hospital emergency department.  °If you develop a fever greater that 101 F,  purulent drainage from wound, increased redness or drainage from wound, foul odor from the wound/dressing, or calf pain - CONTACT YOUR SURGEON.   °                                                °FOLLOW-UP APPOINTMENTS °Make sure you keep all of your appointments after your operation with your surgeon and caregivers. You should call the office at the above phone number and make an appointment for approximately two weeks after the date of your surgery or on the date instructed by your surgeon outlined in the "After Visit Summary". ° °RANGE OF MOTION AND STRENGTHENING EXERCISES  °These exercises are designed to help you keep full movement of your hip joint. Follow your caregiver's or physical therapist's instructions. Perform all exercises about fifteen times, three times per day or as directed. Exercise both hips, even if you   have had only one joint replacement. These exercises can be done on a training (exercise) mat, on the floor, on a table or on a bed. Use whatever works the best and is most comfortable for you. Use music or television while you are exercising so that the exercises are a pleasant break in your day. This will make your life better with the exercises acting as a break in routine you can look forward to.  °Lying on your back, slowly slide your foot toward your buttocks, raising your knee up off the floor. Then slowly slide your foot back down until your leg is straight again.  °Lying on your back spread your legs as far apart as you can without causing discomfort.  °Lying on your side, raise your upper leg and foot straight up from the floor as far as is comfortable. Slowly lower the leg and repeat.  °Lying on your back, tighten up the muscle in the front of your thigh (quadriceps muscles). You can do this by keeping your leg straight and trying to raise your heel off the floor. This helps strengthen the largest muscle supporting your knee.  °Lying on your back, tighten up the muscles of your  buttocks both with the legs straight and with the knee bent at a comfortable angle while keeping your heel on the floor.  ° °IF YOU ARE TRANSFERRED TO A SKILLED REHAB FACILITY °If the patient is transferred to a skilled rehab facility following release from the hospital, a list of the current medications will be sent to the facility for the patient to continue.  When discharged from the skilled rehab facility, please have the facility set up the patient's Home Health Physical Therapy prior to being released. Also, the skilled facility will be responsible for providing the patient with their medications at time of release from the facility to include their pain medication, the muscle relaxants, and their blood thinner medication. If the patient is still at the rehab facility at time of the two week follow up appointment, the skilled rehab facility will also need to assist the patient in arranging follow up appointment in our office and any transportation needs. ° °MAKE SURE YOU:  °Understand these instructions.  °Get help right away if you are not doing well or get worse.  ° ° °Pick up stool softner and laxative for home use following surgery while on pain medications. °Do not submerge incision under water. °Please use good hand washing techniques while changing dressing each day. °May shower starting three days after surgery. °Please use a clean towel to pat the incision dry following showers. °Continue to use ice for pain and swelling after surgery. °Do not use any lotions or creams on the incision until instructed by your surgeon. ° °Take Xarelto for two and a half more weeks following discharge from the hospital, then discontinue Xarelto. °Once the patient has completed the blood thinner regimen, then take a Baby 81 mg Aspirin daily for three more weeks. ° ° ° °Information on my medicine - XARELTO® (Rivaroxaban) ° °This medication education was reviewed with me or my healthcare representative as part of my  discharge preparation.   ° °Why was Xarelto® prescribed for you? °Xarelto® was prescribed for you to reduce the risk of blood clots forming after orthopedic surgery. The medical term for these abnormal blood clots is venous thromboembolism (VTE). ° °What do you need to know about xarelto® ? °Take your Xarelto® ONCE DAILY at the same time every day. °You   may take it either with or without food. ° °If you have difficulty swallowing the tablet whole, you may crush it and mix in applesauce just prior to taking your dose. ° °Take Xarelto® exactly as prescribed by your doctor and DO NOT stop taking Xarelto® without talking to the doctor who prescribed the medication.  Stopping without other VTE prevention medication to take the place of Xarelto® may increase your risk of developing a clot. ° °After discharge, you should have regular check-up appointments with your healthcare provider that is prescribing your Xarelto®.   ° °What do you do if you miss a dose? °If you miss a dose, take it as soon as you remember on the same day then continue your regularly scheduled once daily regimen the next day. Do not take two doses of Xarelto® on the same day.  ° °Important Safety Information °A possible side effect of Xarelto® is bleeding. You should call your healthcare provider right away if you experience any of the following: °? Bleeding from an injury or your nose that does not stop. °? Unusual colored urine (red or dark brown) or unusual colored stools (red or black). °? Unusual bruising for unknown reasons. °? A serious fall or if you hit your head (even if there is no bleeding). ° °Some medicines may interact with Xarelto® and might increase your risk of bleeding while on Xarelto®. To help avoid this, consult your healthcare provider or pharmacist prior to using any new prescription or non-prescription medications, including herbals, vitamins, non-steroidal anti-inflammatory drugs (NSAIDs) and supplements. ° °This website has  more information on Xarelto®: www.xarelto.com. ° ° °

## 2017-09-18 NOTE — Interval H&P Note (Signed)
History and Physical Interval Note:  09/18/2017 8:20 AM  Sara MoutonSherry Fetherolf  has presented today for surgery, with the diagnosis of Osteoarthritis Right hip  The various methods of treatment have been discussed with the patient and family. After consideration of risks, benefits and other options for treatment, the patient has consented to  Procedure(s): RIGHT TOTAL HIP ARTHROPLASTY ANTERIOR APPROACH (Right) as a surgical intervention .  The patient's history has been reviewed, patient examined, no change in status, stable for surgery.  I have reviewed the patient's chart and labs.  Questions were answered to the patient's satisfaction.     Homero FellersFrank Quilla Freeze

## 2017-09-18 NOTE — Transfer of Care (Signed)
Immediate Anesthesia Transfer of Care Note  Patient: Sara Townsend  Procedure(s) Performed: RIGHT TOTAL HIP ARTHROPLASTY ANTERIOR APPROACH (Right Hip)  Patient Location: PACU  Anesthesia Type:Spinal  Level of Consciousness: drowsy and patient cooperative  Airway & Oxygen Therapy: Patient Spontanous Breathing and Patient connected to face mask oxygen  Post-op Assessment: Report given to RN and Post -op Vital signs reviewed and stable  Post vital signs: Reviewed and stable  Last Vitals:  Vitals:   09/18/17 0630  BP: 112/70  Pulse: (!) 50  Resp: 16  Temp: 36.8 C  SpO2: 98%    Last Pain:  Vitals:   09/18/17 0646  TempSrc:   PainSc: 8       Patients Stated Pain Goal: 4 (09/18/17 0646)  Complications: No apparent anesthesia complications

## 2017-09-18 NOTE — Anesthesia Procedure Notes (Signed)
Spinal  Patient location during procedure: OR Start time: 09/18/2017 8:31 AM End time: 09/18/2017 8:36 AM Staffing Anesthesiologist: Bethena Midgetddono, Ernest, MD Resident/CRNA: Epimenio SarinJarvela, Ashan Cueva R, CRNA Performed: resident/CRNA  Preanesthetic Checklist Completed: patient identified, surgical consent, pre-op evaluation, timeout performed, IV checked, risks and benefits discussed and monitors and equipment checked Spinal Block Patient position: sitting Prep: DuraPrep Patient monitoring: heart rate, cardiac monitor, continuous pulse ox and blood pressure Approach: midline Location: L3-4 Needle Needle type: Pencan  Needle gauge: 24 G Needle length: 10 cm Needle insertion depth: 8 cm Assessment Sensory level: T6
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
## 2017-09-18 NOTE — Progress Notes (Signed)
Per Dr. Tacy Duraddono, no pregnancy test needed this morning prior to surgery. Pt has IUD and husband has had vasectomy.

## 2017-09-18 NOTE — Op Note (Signed)
OPERATIVE REPORT- TOTAL HIP ARTHROPLASTY   PREOPERATIVE DIAGNOSIS: Osteoarthritis of the Right hip.   POSTOPERATIVE DIAGNOSIS: Osteoarthritis of the Right  hip.   PROCEDURE: Right total hip arthroplasty, anterior approach.   SURGEON: Ollen GrossFrank Maybelline Kolarik, MD   ASSISTANT: Avel Peacerew Perkins, PA-C  ANESTHESIA:  Spinal  ESTIMATED BLOOD LOSS:-750 mL    DRAINS: Hemovac x1.   COMPLICATIONS: None   CONDITION: PACU - hemodynamically stable.   BRIEF CLINICAL NOTE: Sara MoutonSherry Townsend is a 47 y.o. female who has advanced arthritis of their Right  hip with progressively worsening pain and  dysfunction.The patient has failed nonoperative management and presents for  total hip arthroplasty.   PROCEDURE IN DETAIL: After successful administration of spinal  anesthetic, the traction boots for the [facility] bed were placed on both  feet and the [facility], boots placed into the leg  holders. The Right hip was then isolated from the perineum with plastic  drapes and prepped and draped in the usual sterile fashion. ASIS and  greater trochanter were marked and a oblique incision was made, starting  at about 1 cm lateral and 2 cm distal to the ASIS and coursing towards  the anterior cortex of the femur. The skin was cut with a 10 blade  through subcutaneous tissue to the level of the fascia overlying the  tensor fascia lata muscle. The fascia was then incised in line with the  incision at the junction of the anterior third and posterior 2/3rd. The  muscle was teased off the fascia and then the interval between the TFL  and the rectus was developed. The Hohmann retractor was then placed at  the top of the femoral neck over the capsule. The vessels overlying the  capsule were cauterized and the fat on top of the capsule was removed.  A Hohmann retractor was then placed anterior underneath the rectus  femoris to give exposure to the entire anterior capsule. A T-shaped  capsulotomy was  performed. The edges were tagged and the femoral head  was identified.       Osteophytes are removed off the superior acetabulum.  The femoral neck was then cut in situ with an oscillating saw. Traction  was then applied to the left lower extremity utilizing the [facility]  traction. The femoral head was then removed. Retractors were placed  around the acetabulum and then circumferential removal of the labrum was  performed. Osteophytes were also removed. Reaming starts at 45 mm to  medialize and  Increased in 2 mm increments to 49 mm. We reamed in  approximately 40 degrees of abduction, 20 degrees anteversion. A 50 mm  pinnacle acetabular shell was then impacted in anatomic position under  fluoroscopic guidance with excellent purchase. We did not need to place  any additional dome screws. A 32 mm neutal + 4 marathon liner was then  placed into the acetabular shell.       The femoral lift was then placed along the lateral aspect of the femur  just distal to the vastus ridge. The leg was  externally rotated and capsule  was stripped off the inferior aspect of the femoral neck down to the  level of the lesser trochanter, this was done with electrocautery. The femur was lifted after this was performed. The  leg was then placed in an extended and adducted position essentially delivering the femur. We also removed the capsule superiorly and the piriformis from the piriformis fossa to gain excellent  exposure of the  proximal femur. Rongeur was used to remove some cancellous bone to get  into the lateral portion of the proximal femur for placement of the  initial starter reamer. The starter broaches was placed  the starter broach  and was shown to go down the center of the canal. Broaching  with the  Corail system was then performed starting at size 8, coursing  Up to size 9. A size 9 had excellent torsional and rotational  and axial stability. The trial high offset neck was then placed  with a 32 + 5  trial head. The hip was then reduced. We confirmed that  the stem was in the canal both on AP and lateral x-rays. It also has excellent sizing. The hip was reduced with outstanding stability through full extension and full external rotation.. AP pelvis was taken and the leg lengths were measured and found to be equal. Hip was then dislocated again and the femoral head and neck removed. The  femoral broach was removed. Size 9 Corail stem with a highoffset  neck was then impacted into the femur following native anteversion. Has  excellent purchase in the canal. Excellent torsional and rotational and  axial stability. It is confirmed to be in the canal on AP and lateral  fluoroscopic views. The 32 + 5 ceramic head was placed and the hip  reduced with outstanding stability. Again AP pelvis was taken and it  confirmed that the leg lengths were equal. The wound was then copiously  irrigated with saline solution and the capsule reattached and repaired  with Ethibond suture. 30 ml of .25% Bupivicaine was  injected into the capsule and into the edge of the tensor fascia lata as well as subcutaneous tissue. The fascia overlying the tensor fascia lata was then closed with a running #1 V-Loc. Subcu was closed with interrupted 2-0 Vicryl and subcuticular running 4-0 Monocryl. Incision was cleaned  and dried. Steri-Strips and a bulky sterile dressing applied. Hemovac  drain was hooked to suction and then the patient was awakened and transported to  recovery in stable condition.        Please note that a surgical assistant was a medical necessity for this procedure to perform it in a safe and expeditious manner. Assistant was necessary to provide appropriate retraction of vital neurovascular structures and to prevent femoral fracture and allow for anatomic placement of the prosthesis.  Ollen Gross, M.D.

## 2017-09-18 NOTE — Anesthesia Postprocedure Evaluation (Signed)
Anesthesia Post Note  Patient: Cordelia PenSherry Capece  Procedure(s) Performed: RIGHT TOTAL HIP ARTHROPLASTY ANTERIOR APPROACH (Right Hip)     Patient location during evaluation: PACU Anesthesia Type: Spinal Level of consciousness: oriented and awake and alert Pain management: pain level controlled Vital Signs Assessment: post-procedure vital signs reviewed and stable Respiratory status: spontaneous breathing, respiratory function stable and patient connected to nasal cannula oxygen Cardiovascular status: blood pressure returned to baseline and stable Postop Assessment: no headache, no backache and no apparent nausea or vomiting Anesthetic complications: no    Last Vitals:  Vitals:   09/18/17 0630 09/18/17 1030  BP: 112/70   Pulse: (!) 50   Resp: 16   Temp: 36.8 C (P) 36.4 C  SpO2: 98%     Last Pain:  Vitals:   09/18/17 1030  TempSrc:   PainSc: (P) 0-No pain                 Tashonda Pinkus

## 2017-09-19 LAB — BASIC METABOLIC PANEL
Anion gap: 5 (ref 5–15)
BUN: 13 mg/dL (ref 6–20)
CO2: 28 mmol/L (ref 22–32)
Calcium: 8.6 mg/dL — ABNORMAL LOW (ref 8.9–10.3)
Chloride: 104 mmol/L (ref 101–111)
Creatinine, Ser: 0.62 mg/dL (ref 0.44–1.00)
GFR calc non Af Amer: >60 mL/min (ref >60)
Glucose, Bld: 98 mg/dL (ref 65–99)
POTASSIUM: 4.1 mmol/L (ref 3.5–5.1)
SODIUM: 137 mmol/L (ref 135–145)

## 2017-09-19 LAB — CBC
HCT: 31.4 % — ABNORMAL LOW (ref 36.0–46.0)
HEMOGLOBIN: 10.6 g/dL — AB (ref 12.0–15.0)
MCH: 32.2 pg (ref 26.0–34.0)
MCHC: 33.8 g/dL (ref 30.0–36.0)
MCV: 95.4 fL (ref 78.0–100.0)
Platelets: 137 10*3/uL — ABNORMAL LOW (ref 150–400)
RBC: 3.29 MIL/uL — AB (ref 3.87–5.11)
RDW: 12.7 % (ref 11.5–15.5)
WBC: 9.6 10*3/uL (ref 4.0–10.5)

## 2017-09-19 MED ORDER — METHOCARBAMOL 500 MG PO TABS: 500.0000 mg | ORAL_TABLET | Freq: Four times a day (QID) | ORAL | 0 refills | Status: DC | PRN

## 2017-09-19 MED ORDER — OXYCODONE HCL 5 MG PO TABS: 5.0000 mg | ORAL_TABLET | ORAL | 0 refills | Status: DC | PRN

## 2017-09-19 MED ORDER — RIVAROXABAN 10 MG PO TABS: 10.0000 mg | ORAL_TABLET | Freq: Every day | ORAL | 0 refills | Status: DC

## 2017-09-19 MED ORDER — TRAMADOL HCL 50 MG PO TABS: 50.0000 mg | ORAL_TABLET | Freq: Four times a day (QID) | ORAL | 0 refills | Status: DC | PRN
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
## 2017-09-19 NOTE — Progress Notes (Signed)
Discharge planning, spoke with patient and spouse at bedside. [facility] and treat. Contacted [facility] for referral. Needs RW and 3n1, contacted [facility] to deliver to room. States has a high bed, per PT step stool would be appropriate to purchase. [phone]
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
## 2017-09-20 LAB — BASIC METABOLIC PANEL
Anion gap: 6 (ref 5–15)
BUN: 15 mg/dL (ref 6–20)
CALCIUM: 8.3 mg/dL — AB (ref 8.9–10.3)
CO2: 29 mmol/L (ref 22–32)
CREATININE: 0.6 mg/dL (ref 0.44–1.00)
Chloride: 104 mmol/L (ref 101–111)
Glucose, Bld: 96 mg/dL (ref 65–99)
Potassium: 3.8 mmol/L (ref 3.5–5.1)
SODIUM: 139 mmol/L (ref 135–145)

## 2017-09-20 LAB — CBC
HCT: 30.4 % — ABNORMAL LOW (ref 36.0–46.0)
HEMOGLOBIN: 10.3 g/dL — AB (ref 12.0–15.0)
MCH: 32.6 pg (ref 26.0–34.0)
MCHC: 33.9 g/dL (ref 30.0–36.0)
MCV: 96.2 fL (ref 78.0–100.0)
PLATELETS: 126 10*3/uL — AB (ref 150–400)
RBC: 3.16 MIL/uL — ABNORMAL LOW (ref 3.87–5.11)
RDW: 12.9 % (ref 11.5–15.5)
WBC: 10.1 10*3/uL (ref 4.0–10.5)

## 2017-09-20 MED ORDER — HYDROCODONE-ACETAMINOPHEN 5-325 MG PO TABS
1.0000 | ORAL_TABLET | ORAL | Status: DC | PRN
  Administered 2017-09-20: 2 via ORAL
  Filled 2017-09-20: qty 2

## 2017-09-20 MED ORDER — ONDANSETRON HCL 4 MG PO TABS: 4.0000 mg | ORAL_TABLET | Freq: Four times a day (QID) | ORAL | 0 refills | Status: DC | PRN

## 2017-09-20 MED ORDER — METHOCARBAMOL 500 MG PO TABS: 500.0000 mg | ORAL_TABLET | Freq: Four times a day (QID) | ORAL | 0 refills | Status: DC | PRN

## 2017-09-20 MED ORDER — HYDROCODONE-ACETAMINOPHEN 5-325 MG PO TABS: 1.0000 | ORAL_TABLET | ORAL | 0 refills | Status: DC | PRN

## 2017-09-20 MED ORDER — TRAMADOL HCL 50 MG PO TABS: 50.0000 mg | ORAL_TABLET | Freq: Four times a day (QID) | ORAL | 0 refills | Status: DC | PRN

## 2017-09-20 MED ORDER — RIVAROXABAN 10 MG PO TABS: 10.0000 mg | ORAL_TABLET | Freq: Every day | ORAL | 0 refills | Status: DC

## 2017-09-20 NOTE — Progress Notes (Signed)
RN reviewed discharge instructions with patient and family. All questions answered.   Paperwork and prescriptions given.   NT rolled patient down with all belongings to family car. 
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
## 2017-09-22 DIAGNOSIS — E039 Hypothyroidism, unspecified: Secondary | ICD-10-CM | POA: Diagnosis not present

## 2017-09-22 DIAGNOSIS — Z96641 Presence of right artificial hip joint: Secondary | ICD-10-CM | POA: Diagnosis not present

## 2017-09-22 DIAGNOSIS — Z87891 Personal history of nicotine dependence: Secondary | ICD-10-CM | POA: Diagnosis not present

## 2017-09-22 DIAGNOSIS — Z471 Aftercare following joint replacement surgery: Secondary | ICD-10-CM | POA: Diagnosis not present

## 2017-09-22 DIAGNOSIS — Z9181 History of falling: Secondary | ICD-10-CM | POA: Diagnosis not present

## 2017-09-22 DIAGNOSIS — K219 Gastro-esophageal reflux disease without esophagitis: Secondary | ICD-10-CM | POA: Diagnosis not present

## 2017-09-22 DIAGNOSIS — Z7901 Long term (current) use of anticoagulants: Secondary | ICD-10-CM | POA: Diagnosis not present

## 2017-09-25 DIAGNOSIS — K219 Gastro-esophageal reflux disease without esophagitis: Secondary | ICD-10-CM | POA: Diagnosis not present

## 2017-09-25 DIAGNOSIS — E039 Hypothyroidism, unspecified: Secondary | ICD-10-CM | POA: Diagnosis not present

## 2017-09-25 DIAGNOSIS — Z7901 Long term (current) use of anticoagulants: Secondary | ICD-10-CM | POA: Diagnosis not present

## 2017-09-25 DIAGNOSIS — Z471 Aftercare following joint replacement surgery: Secondary | ICD-10-CM | POA: Diagnosis not present

## 2017-09-25 DIAGNOSIS — Z96641 Presence of right artificial hip joint: Secondary | ICD-10-CM | POA: Diagnosis not present

## 2017-09-25 DIAGNOSIS — Z9181 History of falling: Secondary | ICD-10-CM | POA: Diagnosis not present

## 2017-09-25 DIAGNOSIS — Z87891 Personal history of nicotine dependence: Secondary | ICD-10-CM | POA: Diagnosis not present

## 2017-09-27 DIAGNOSIS — Z9181 History of falling: Secondary | ICD-10-CM | POA: Diagnosis not present

## 2017-09-27 DIAGNOSIS — Z7901 Long term (current) use of anticoagulants: Secondary | ICD-10-CM | POA: Diagnosis not present

## 2017-09-27 DIAGNOSIS — Z471 Aftercare following joint replacement surgery: Secondary | ICD-10-CM | POA: Diagnosis not present

## 2017-09-27 DIAGNOSIS — Z96641 Presence of right artificial hip joint: Secondary | ICD-10-CM | POA: Diagnosis not present

## 2017-09-27 DIAGNOSIS — Z87891 Personal history of nicotine dependence: Secondary | ICD-10-CM | POA: Diagnosis not present

## 2017-09-27 DIAGNOSIS — K219 Gastro-esophageal reflux disease without esophagitis: Secondary | ICD-10-CM | POA: Diagnosis not present

## 2017-09-27 DIAGNOSIS — E039 Hypothyroidism, unspecified: Secondary | ICD-10-CM | POA: Diagnosis not present

## 2017-09-30 DIAGNOSIS — Z96641 Presence of right artificial hip joint: Secondary | ICD-10-CM | POA: Diagnosis not present

## 2017-09-30 DIAGNOSIS — Z9181 History of falling: Secondary | ICD-10-CM | POA: Diagnosis not present

## 2017-09-30 DIAGNOSIS — K219 Gastro-esophageal reflux disease without esophagitis: Secondary | ICD-10-CM | POA: Diagnosis not present

## 2017-09-30 DIAGNOSIS — E039 Hypothyroidism, unspecified: Secondary | ICD-10-CM | POA: Diagnosis not present

## 2017-09-30 DIAGNOSIS — Z471 Aftercare following joint replacement surgery: Secondary | ICD-10-CM | POA: Diagnosis not present

## 2017-09-30 DIAGNOSIS — Z87891 Personal history of nicotine dependence: Secondary | ICD-10-CM | POA: Diagnosis not present

## 2017-09-30 DIAGNOSIS — Z7901 Long term (current) use of anticoagulants: Secondary | ICD-10-CM | POA: Diagnosis not present

## 2017-10-01 DIAGNOSIS — M25512 Pain in left shoulder: Secondary | ICD-10-CM | POA: Diagnosis not present

## 2017-10-02 DIAGNOSIS — K219 Gastro-esophageal reflux disease without esophagitis: Secondary | ICD-10-CM | POA: Diagnosis not present

## 2017-10-02 DIAGNOSIS — Z471 Aftercare following joint replacement surgery: Secondary | ICD-10-CM | POA: Diagnosis not present

## 2017-10-02 DIAGNOSIS — Z96641 Presence of right artificial hip joint: Secondary | ICD-10-CM | POA: Diagnosis not present

## 2017-10-02 DIAGNOSIS — E039 Hypothyroidism, unspecified: Secondary | ICD-10-CM | POA: Diagnosis not present

## 2017-10-02 DIAGNOSIS — Z7901 Long term (current) use of anticoagulants: Secondary | ICD-10-CM | POA: Diagnosis not present

## 2017-10-02 DIAGNOSIS — Z87891 Personal history of nicotine dependence: Secondary | ICD-10-CM | POA: Diagnosis not present

## 2017-10-02 DIAGNOSIS — Z9181 History of falling: Secondary | ICD-10-CM | POA: Diagnosis not present

## 2017-10-04 DIAGNOSIS — Z7901 Long term (current) use of anticoagulants: Secondary | ICD-10-CM | POA: Diagnosis not present

## 2017-10-04 DIAGNOSIS — Z471 Aftercare following joint replacement surgery: Secondary | ICD-10-CM | POA: Diagnosis not present

## 2017-10-04 DIAGNOSIS — Z87891 Personal history of nicotine dependence: Secondary | ICD-10-CM | POA: Diagnosis not present

## 2017-10-04 DIAGNOSIS — K219 Gastro-esophageal reflux disease without esophagitis: Secondary | ICD-10-CM | POA: Diagnosis not present

## 2017-10-04 DIAGNOSIS — E039 Hypothyroidism, unspecified: Secondary | ICD-10-CM | POA: Diagnosis not present

## 2017-10-04 DIAGNOSIS — Z96641 Presence of right artificial hip joint: Secondary | ICD-10-CM | POA: Diagnosis not present

## 2017-10-04 DIAGNOSIS — Z9181 History of falling: Secondary | ICD-10-CM | POA: Diagnosis not present

## 2017-10-22 DIAGNOSIS — Z471 Aftercare following joint replacement surgery: Secondary | ICD-10-CM | POA: Diagnosis not present

## 2017-10-22 DIAGNOSIS — Z96641 Presence of right artificial hip joint: Secondary | ICD-10-CM | POA: Diagnosis not present

## 2017-12-09 DIAGNOSIS — M25512 Pain in left shoulder: Secondary | ICD-10-CM | POA: Diagnosis not present

## 2017-12-24 DIAGNOSIS — M25512 Pain in left shoulder: Secondary | ICD-10-CM | POA: Diagnosis not present

## 2017-12-30 DIAGNOSIS — M25512 Pain in left shoulder: Secondary | ICD-10-CM | POA: Diagnosis not present

## 2018-01-08 DIAGNOSIS — M7542 Impingement syndrome of left shoulder: Secondary | ICD-10-CM | POA: Diagnosis not present

## 2018-01-15 DIAGNOSIS — M25512 Pain in left shoulder: Secondary | ICD-10-CM | POA: Diagnosis not present

## 2018-01-20 DIAGNOSIS — M25512 Pain in left shoulder: Secondary | ICD-10-CM | POA: Diagnosis not present

## 2018-01-23 DIAGNOSIS — M7542 Impingement syndrome of left shoulder: Secondary | ICD-10-CM | POA: Diagnosis not present

## 2018-01-29 DIAGNOSIS — M7542 Impingement syndrome of left shoulder: Secondary | ICD-10-CM | POA: Diagnosis not present

## 2018-01-29 DIAGNOSIS — M25512 Pain in left shoulder: Secondary | ICD-10-CM | POA: Diagnosis not present

## 2018-02-05 DIAGNOSIS — Z01419 Encounter for gynecological examination (general) (routine) without abnormal findings: Secondary | ICD-10-CM | POA: Diagnosis not present

## 2018-02-05 DIAGNOSIS — Z6837 Body mass index (BMI) 37.0-37.9, adult: Secondary | ICD-10-CM | POA: Diagnosis not present

## 2018-02-05 DIAGNOSIS — Z1231 Encounter for screening mammogram for malignant neoplasm of breast: Secondary | ICD-10-CM | POA: Diagnosis not present

## 2018-02-27 DIAGNOSIS — Z96641 Presence of right artificial hip joint: Secondary | ICD-10-CM | POA: Diagnosis not present

## 2018-04-05 DIAGNOSIS — J029 Acute pharyngitis, unspecified: Secondary | ICD-10-CM | POA: Diagnosis not present

## 2018-04-05 DIAGNOSIS — H6500 Acute serous otitis media, unspecified ear: Secondary | ICD-10-CM | POA: Diagnosis not present

## 2018-09-09 DIAGNOSIS — Z9884 Bariatric surgery status: Secondary | ICD-10-CM | POA: Diagnosis not present

## 2018-09-09 DIAGNOSIS — M25551 Pain in right hip: Secondary | ICD-10-CM | POA: Diagnosis not present

## 2018-09-09 DIAGNOSIS — E039 Hypothyroidism, unspecified: Secondary | ICD-10-CM | POA: Diagnosis not present

## 2018-09-09 DIAGNOSIS — Z0001 Encounter for general adult medical examination with abnormal findings: Secondary | ICD-10-CM | POA: Diagnosis not present

## 2019-01-08 DIAGNOSIS — R635 Abnormal weight gain: Secondary | ICD-10-CM | POA: Diagnosis not present

## 2019-02-18 DIAGNOSIS — Z903 Acquired absence of stomach [part of]: Secondary | ICD-10-CM | POA: Diagnosis not present

## 2019-02-18 DIAGNOSIS — Z713 Dietary counseling and surveillance: Secondary | ICD-10-CM | POA: Diagnosis not present

## 2019-02-18 DIAGNOSIS — Z79899 Other long term (current) drug therapy: Secondary | ICD-10-CM | POA: Diagnosis not present

## 2019-04-02 DIAGNOSIS — Z903 Acquired absence of stomach [part of]: Secondary | ICD-10-CM | POA: Diagnosis not present

## 2019-08-22 DIAGNOSIS — N7689 Other specified inflammation of vagina and vulva: Secondary | ICD-10-CM | POA: Diagnosis not present

## 2019-12-05 DIAGNOSIS — Z03818 Encounter for observation for suspected exposure to other biological agents ruled out: Secondary | ICD-10-CM | POA: Diagnosis not present

## 2019-12-05 DIAGNOSIS — Z20828 Contact with and (suspected) exposure to other viral communicable diseases: Secondary | ICD-10-CM | POA: Diagnosis not present

## 2020-01-14 DIAGNOSIS — Z0000 Encounter for general adult medical examination without abnormal findings: Secondary | ICD-10-CM | POA: Diagnosis not present

## 2020-01-14 DIAGNOSIS — E785 Hyperlipidemia, unspecified: Secondary | ICD-10-CM | POA: Diagnosis not present

## 2020-01-14 DIAGNOSIS — E039 Hypothyroidism, unspecified: Secondary | ICD-10-CM | POA: Diagnosis not present

## 2020-02-25 DIAGNOSIS — Z6839 Body mass index (BMI) 39.0-39.9, adult: Secondary | ICD-10-CM | POA: Diagnosis not present

## 2020-02-25 DIAGNOSIS — Z01419 Encounter for gynecological examination (general) (routine) without abnormal findings: Secondary | ICD-10-CM | POA: Diagnosis not present

## 2020-02-25 DIAGNOSIS — Z1231 Encounter for screening mammogram for malignant neoplasm of breast: Secondary | ICD-10-CM | POA: Diagnosis not present

## 2020-06-17 ENCOUNTER — Emergency Department (HOSPITAL_BASED_OUTPATIENT_CLINIC_OR_DEPARTMENT_OTHER)
Admission: EM | Admit: 2020-06-17 | Discharge: 2020-06-17 | Disposition: A | Discharge status: Home / Self Care | Payer: BC Managed Care – PPO | Attending: Emergency Medicine | Admitting: Emergency Medicine

## 2020-06-17 ENCOUNTER — Emergency Department (HOSPITAL_BASED_OUTPATIENT_CLINIC_OR_DEPARTMENT_OTHER): Payer: BC Managed Care – PPO

## 2020-06-17 ENCOUNTER — Other Ambulatory Visit: Payer: Self-pay

## 2020-06-17 ENCOUNTER — Encounter (HOSPITAL_BASED_OUTPATIENT_CLINIC_OR_DEPARTMENT_OTHER): Payer: Self-pay | Admitting: *Deleted

## 2020-06-17 DIAGNOSIS — Z7901 Long term (current) use of anticoagulants: Secondary | ICD-10-CM | POA: Diagnosis not present

## 2020-06-17 DIAGNOSIS — Z96641 Presence of right artificial hip joint: Secondary | ICD-10-CM | POA: Insufficient documentation

## 2020-06-17 DIAGNOSIS — E039 Hypothyroidism, unspecified: Secondary | ICD-10-CM | POA: Diagnosis not present

## 2020-06-17 DIAGNOSIS — M545 Low back pain, unspecified: Secondary | ICD-10-CM | POA: Diagnosis not present

## 2020-06-17 DIAGNOSIS — Z9049 Acquired absence of other specified parts of digestive tract: Secondary | ICD-10-CM | POA: Diagnosis not present

## 2020-06-17 DIAGNOSIS — R109 Unspecified abdominal pain: Secondary | ICD-10-CM | POA: Diagnosis not present

## 2020-06-17 DIAGNOSIS — Z20822 Contact with and (suspected) exposure to covid-19: Secondary | ICD-10-CM | POA: Insufficient documentation

## 2020-06-17 DIAGNOSIS — Z79890 Hormone replacement therapy (postmenopausal): Secondary | ICD-10-CM | POA: Insufficient documentation

## 2020-06-17 DIAGNOSIS — K219 Gastro-esophageal reflux disease without esophagitis: Secondary | ICD-10-CM | POA: Diagnosis not present

## 2020-06-17 DIAGNOSIS — R1031 Right lower quadrant pain: Secondary | ICD-10-CM | POA: Diagnosis not present

## 2020-06-17 DIAGNOSIS — R9431 Abnormal electrocardiogram [ECG] [EKG]: Secondary | ICD-10-CM | POA: Diagnosis not present

## 2020-06-17 LAB — COMPREHENSIVE METABOLIC PANEL
ALT: 16 U/L (ref 0–44)
AST: 19 U/L (ref 15–41)
Albumin: 3.8 g/dL (ref 3.5–5.0)
Alkaline Phosphatase: 54 U/L (ref 38–126)
Anion gap: 7 (ref 5–15)
BUN: 25 mg/dL — ABNORMAL HIGH (ref 6–20)
CO2: 25 mmol/L (ref 22–32)
Calcium: 8.7 mg/dL — ABNORMAL LOW (ref 8.9–10.3)
Chloride: 105 mmol/L (ref 98–111)
Creatinine, Ser: 0.7 mg/dL (ref 0.44–1.00)
GFR, Estimated: >60 mL/min (ref >60)
Glucose, Bld: 93 mg/dL (ref 70–99)
Potassium: 4 mmol/L (ref 3.5–5.1)
Sodium: 137 mmol/L (ref 135–145)
Total Bilirubin: 0.9 mg/dL (ref 0.3–1.2)
Total Protein: 6.8 g/dL (ref 6.5–8.1)

## 2020-06-17 LAB — URINALYSIS, MICROSCOPIC (REFLEX)

## 2020-06-17 LAB — CBC WITH DIFFERENTIAL/PLATELET
Abs Immature Granulocytes: 0.01 10*3/uL (ref 0.00–0.07)
Basophils Absolute: 0 10*3/uL (ref 0.0–0.1)
Basophils Relative: 1 %
Eosinophils Absolute: 0.3 10*3/uL (ref 0.0–0.5)
Eosinophils Relative: 5 %
HCT: 40.2 % (ref 36.0–46.0)
Hemoglobin: 13.6 g/dL (ref 12.0–15.0)
Immature Granulocytes: 0 %
Lymphocytes Relative: 25 %
Lymphs Abs: 1.3 10*3/uL (ref 0.7–4.0)
MCH: 32.2 pg (ref 26.0–34.0)
MCHC: 33.8 g/dL (ref 30.0–36.0)
MCV: 95.3 fL (ref 80.0–100.0)
Monocytes Absolute: 0.5 10*3/uL (ref 0.1–1.0)
Monocytes Relative: 9 %
Neutro Abs: 3.1 10*3/uL (ref 1.7–7.7)
Neutrophils Relative %: 60 %
Platelets: 174 10*3/uL (ref 150–400)
RBC: 4.22 MIL/uL (ref 3.87–5.11)
RDW: 12.1 % (ref 11.5–15.5)
WBC: 5.2 10*3/uL (ref 4.0–10.5)
nRBC: 0 % (ref 0.0–0.2)

## 2020-06-17 LAB — URINALYSIS, ROUTINE W REFLEX MICROSCOPIC
Bilirubin Urine: NEGATIVE
Glucose, UA: NEGATIVE mg/dL
Ketones, ur: NEGATIVE mg/dL
Leukocytes,Ua: NEGATIVE
Nitrite: NEGATIVE
Protein, ur: NEGATIVE mg/dL
Specific Gravity, Urine: 1.025 (ref 1.005–1.030)
pH: 6 (ref 5.0–8.0)

## 2020-06-17 LAB — LIPASE, BLOOD: Lipase: 26 U/L (ref 11–51)

## 2020-06-17 LAB — SARS CORONAVIRUS 2 BY RT PCR (HOSPITAL ORDER, PERFORMED IN ~~LOC~~ HOSPITAL LAB): SARS Coronavirus 2: NEGATIVE

## 2020-06-17 LAB — PREGNANCY, URINE: Preg Test, Ur: NEGATIVE

## 2020-06-17 MED ORDER — KETOROLAC TROMETHAMINE 30 MG/ML IJ SOLN
30.0000 mg | Freq: Once | INTRAMUSCULAR | Status: AC
  Administered 2020-06-17: 30 mg via INTRAMUSCULAR
  Filled 2020-06-17: qty 1

## 2020-06-17 MED ORDER — FAMOTIDINE IN NACL 20-0.9 MG/50ML-% IV SOLN
20.0000 mg | Freq: Once | INTRAVENOUS | Status: AC
  Administered 2020-06-17: 20 mg via INTRAVENOUS
  Filled 2020-06-17: qty 50

## 2020-06-17 MED ORDER — ETODOLAC 300 MG PO CAPS: 300.0000 mg | ORAL_CAPSULE | Freq: Three times a day (TID) | ORAL | 0 refills | Status: DC

## 2020-06-17 MED ORDER — CYCLOBENZAPRINE HCL 10 MG PO TABS: 10.0000 mg | ORAL_TABLET | Freq: Two times a day (BID) | ORAL | 0 refills | Status: DC | PRN

## 2020-06-17 MED ORDER — ONDANSETRON HCL 4 MG/2ML IJ SOLN
4.0000 mg | Freq: Once | INTRAMUSCULAR | Status: AC
  Administered 2020-06-17: 4 mg via INTRAVENOUS
  Filled 2020-06-17: qty 2

## 2020-06-17 MED ORDER — MORPHINE SULFATE (PF) 4 MG/ML IV SOLN
4.0000 mg | Freq: Once | INTRAVENOUS | Status: AC
  Administered 2020-06-17: 4 mg via INTRAVENOUS
  Filled 2020-06-17: qty 1

## 2020-06-17 NOTE — Discharge Instructions (Addendum)
Take the medications as prescribed. Follow-up with your doctor next week to be rechecked to consider further evaluation if the symptoms persist. Monitor and return to the ED for fevers, shortness of breath

## 2020-06-17 NOTE — ED Notes (Signed)
Pt called for nurse.  Feels like her "heart's racing".  Sweating and hot. Tachypnea. Nausea. EDP came to bedside.  Will place on monitor and give Zofran.

## 2020-06-17 NOTE — ED Notes (Signed)
Per radiology protocol -- preg test needed due to the pt is still having menstrual cycles -- charge RN notified

## 2020-06-17 NOTE — ED Triage Notes (Signed)
C/o rt side and back pain x 1 weeks,  Denies ua sx, denies n/v
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
## 2020-06-18 LAB — URINE CULTURE: Culture: <10000 — AB

## 2020-07-18 DIAGNOSIS — K219 Gastro-esophageal reflux disease without esophagitis: Secondary | ICD-10-CM | POA: Diagnosis not present

## 2020-09-12 DIAGNOSIS — T8339XA Other mechanical complication of intrauterine contraceptive device, initial encounter: Secondary | ICD-10-CM | POA: Diagnosis not present

## 2020-09-12 DIAGNOSIS — Z30433 Encounter for removal and reinsertion of intrauterine contraceptive device: Secondary | ICD-10-CM | POA: Diagnosis not present

## 2020-10-13 DIAGNOSIS — T8339XD Other mechanical complication of intrauterine contraceptive device, subsequent encounter: Secondary | ICD-10-CM | POA: Diagnosis not present

## 2020-10-13 DIAGNOSIS — Z30433 Encounter for removal and reinsertion of intrauterine contraceptive device: Secondary | ICD-10-CM | POA: Diagnosis not present

## 2020-12-21 DIAGNOSIS — R635 Abnormal weight gain: Secondary | ICD-10-CM | POA: Diagnosis not present

## 2020-12-26 DIAGNOSIS — E039 Hypothyroidism, unspecified: Secondary | ICD-10-CM | POA: Diagnosis not present

## 2020-12-26 DIAGNOSIS — K219 Gastro-esophageal reflux disease without esophagitis: Secondary | ICD-10-CM | POA: Diagnosis not present

## 2020-12-26 DIAGNOSIS — E785 Hyperlipidemia, unspecified: Secondary | ICD-10-CM | POA: Diagnosis not present

## 2020-12-26 DIAGNOSIS — E669 Obesity, unspecified: Secondary | ICD-10-CM | POA: Diagnosis not present

## 2020-12-27 ENCOUNTER — Other Ambulatory Visit: Payer: Self-pay | Admitting: Nurse Practitioner

## 2020-12-27 DIAGNOSIS — J0100 Acute maxillary sinusitis, unspecified: Secondary | ICD-10-CM | POA: Diagnosis not present

## 2020-12-27 DIAGNOSIS — R635 Abnormal weight gain: Secondary | ICD-10-CM

## 2021-01-06 DIAGNOSIS — R7303 Prediabetes: Secondary | ICD-10-CM | POA: Diagnosis not present

## 2021-01-06 DIAGNOSIS — E039 Hypothyroidism, unspecified: Secondary | ICD-10-CM | POA: Diagnosis not present

## 2021-01-06 DIAGNOSIS — E569 Vitamin deficiency, unspecified: Secondary | ICD-10-CM | POA: Diagnosis not present

## 2021-01-06 DIAGNOSIS — R5383 Other fatigue: Secondary | ICD-10-CM | POA: Diagnosis not present

## 2021-01-19 ENCOUNTER — Other Ambulatory Visit: Payer: Self-pay | Admitting: Nurse Practitioner

## 2021-01-19 ENCOUNTER — Ambulatory Visit
Admission: RE | Admit: 2021-01-19 | Discharge: 2021-01-19 | Disposition: A | Discharge status: Home / Self Care | Payer: BC Managed Care – PPO | Source: Ambulatory Visit | Attending: Nurse Practitioner | Admitting: Nurse Practitioner

## 2021-01-19 DIAGNOSIS — R635 Abnormal weight gain: Secondary | ICD-10-CM

## 2021-01-19 DIAGNOSIS — K219 Gastro-esophageal reflux disease without esophagitis: Secondary | ICD-10-CM | POA: Diagnosis not present

## 2021-02-03 DIAGNOSIS — R635 Abnormal weight gain: Secondary | ICD-10-CM | POA: Diagnosis not present

## 2021-02-14 DIAGNOSIS — Z0000 Encounter for general adult medical examination without abnormal findings: Secondary | ICD-10-CM | POA: Diagnosis not present

## 2021-03-09 DIAGNOSIS — E669 Obesity, unspecified: Secondary | ICD-10-CM | POA: Diagnosis not present

## 2021-03-09 DIAGNOSIS — R748 Abnormal levels of other serum enzymes: Secondary | ICD-10-CM | POA: Diagnosis not present

## 2021-03-09 DIAGNOSIS — E039 Hypothyroidism, unspecified: Secondary | ICD-10-CM | POA: Diagnosis not present

## 2021-03-09 DIAGNOSIS — K219 Gastro-esophageal reflux disease without esophagitis: Secondary | ICD-10-CM | POA: Diagnosis not present

## 2021-03-13 DIAGNOSIS — M7542 Impingement syndrome of left shoulder: Secondary | ICD-10-CM | POA: Diagnosis not present

## 2021-03-13 DIAGNOSIS — M25511 Pain in right shoulder: Secondary | ICD-10-CM | POA: Diagnosis not present

## 2021-03-30 DIAGNOSIS — Z713 Dietary counseling and surveillance: Secondary | ICD-10-CM | POA: Diagnosis not present

## 2021-03-30 DIAGNOSIS — Z6841 Body Mass Index (BMI) 40.0 and over, adult: Secondary | ICD-10-CM | POA: Diagnosis not present

## 2021-04-04 DIAGNOSIS — M25511 Pain in right shoulder: Secondary | ICD-10-CM | POA: Diagnosis not present

## 2021-04-10 DIAGNOSIS — M7501 Adhesive capsulitis of right shoulder: Secondary | ICD-10-CM | POA: Diagnosis not present

## 2021-04-21 DIAGNOSIS — E669 Obesity, unspecified: Secondary | ICD-10-CM | POA: Diagnosis not present

## 2021-04-21 DIAGNOSIS — Z6839 Body mass index (BMI) 39.0-39.9, adult: Secondary | ICD-10-CM | POA: Diagnosis not present

## 2021-04-21 DIAGNOSIS — E559 Vitamin D deficiency, unspecified: Secondary | ICD-10-CM | POA: Diagnosis not present

## 2021-04-21 DIAGNOSIS — K219 Gastro-esophageal reflux disease without esophagitis: Secondary | ICD-10-CM | POA: Diagnosis not present

## 2021-05-04 DIAGNOSIS — Z1231 Encounter for screening mammogram for malignant neoplasm of breast: Secondary | ICD-10-CM | POA: Diagnosis not present

## 2021-05-04 DIAGNOSIS — E559 Vitamin D deficiency, unspecified: Secondary | ICD-10-CM | POA: Diagnosis not present

## 2021-05-04 DIAGNOSIS — L723 Sebaceous cyst: Secondary | ICD-10-CM | POA: Diagnosis not present

## 2021-05-04 DIAGNOSIS — Z713 Dietary counseling and surveillance: Secondary | ICD-10-CM | POA: Diagnosis not present

## 2021-05-04 DIAGNOSIS — Z01419 Encounter for gynecological examination (general) (routine) without abnormal findings: Secondary | ICD-10-CM | POA: Diagnosis not present

## 2021-05-04 DIAGNOSIS — N951 Menopausal and female climacteric states: Secondary | ICD-10-CM | POA: Diagnosis not present

## 2021-05-04 DIAGNOSIS — E039 Hypothyroidism, unspecified: Secondary | ICD-10-CM | POA: Diagnosis not present

## 2021-05-04 DIAGNOSIS — N7689 Other specified inflammation of vagina and vulva: Secondary | ICD-10-CM | POA: Diagnosis not present

## 2021-05-04 DIAGNOSIS — E669 Obesity, unspecified: Secondary | ICD-10-CM | POA: Diagnosis not present

## 2021-05-18 DIAGNOSIS — E039 Hypothyroidism, unspecified: Secondary | ICD-10-CM | POA: Diagnosis not present

## 2021-05-18 DIAGNOSIS — E669 Obesity, unspecified: Secondary | ICD-10-CM | POA: Diagnosis not present

## 2021-05-18 DIAGNOSIS — R7303 Prediabetes: Secondary | ICD-10-CM | POA: Diagnosis not present

## 2021-05-29 DIAGNOSIS — M25552 Pain in left hip: Secondary | ICD-10-CM | POA: Diagnosis not present

## 2021-05-31 DIAGNOSIS — Z713 Dietary counseling and surveillance: Secondary | ICD-10-CM | POA: Diagnosis not present

## 2021-06-06 DIAGNOSIS — M25511 Pain in right shoulder: Secondary | ICD-10-CM | POA: Diagnosis not present

## 2021-06-06 DIAGNOSIS — M7501 Adhesive capsulitis of right shoulder: Secondary | ICD-10-CM | POA: Diagnosis not present

## 2021-06-06 DIAGNOSIS — M25512 Pain in left shoulder: Secondary | ICD-10-CM | POA: Diagnosis not present

## 2021-06-20 DIAGNOSIS — K219 Gastro-esophageal reflux disease without esophagitis: Secondary | ICD-10-CM | POA: Diagnosis not present

## 2021-06-20 DIAGNOSIS — E669 Obesity, unspecified: Secondary | ICD-10-CM | POA: Diagnosis not present

## 2021-06-20 DIAGNOSIS — R748 Abnormal levels of other serum enzymes: Secondary | ICD-10-CM | POA: Diagnosis not present

## 2021-06-21 DIAGNOSIS — M25512 Pain in left shoulder: Secondary | ICD-10-CM | POA: Diagnosis not present

## 2021-06-21 DIAGNOSIS — M25552 Pain in left hip: Secondary | ICD-10-CM | POA: Diagnosis not present

## 2021-06-29 DIAGNOSIS — M25512 Pain in left shoulder: Secondary | ICD-10-CM | POA: Diagnosis not present

## 2021-07-08 DIAGNOSIS — M23300 Other meniscus derangements, unspecified lateral meniscus, right knee: Secondary | ICD-10-CM | POA: Diagnosis not present

## 2021-07-08 DIAGNOSIS — M25561 Pain in right knee: Secondary | ICD-10-CM | POA: Diagnosis not present

## 2021-07-12 DIAGNOSIS — M25552 Pain in left hip: Secondary | ICD-10-CM | POA: Diagnosis not present

## 2021-07-12 DIAGNOSIS — M25561 Pain in right knee: Secondary | ICD-10-CM | POA: Diagnosis not present

## 2021-07-19 DIAGNOSIS — S83206A Unspecified tear of unspecified meniscus, current injury, right knee, initial encounter: Secondary | ICD-10-CM | POA: Diagnosis not present

## 2021-08-07 DIAGNOSIS — S83206A Unspecified tear of unspecified meniscus, current injury, right knee, initial encounter: Secondary | ICD-10-CM | POA: Diagnosis not present

## 2021-08-10 DIAGNOSIS — M545 Low back pain, unspecified: Secondary | ICD-10-CM | POA: Diagnosis not present

## 2021-08-18 DIAGNOSIS — M5459 Other low back pain: Secondary | ICD-10-CM | POA: Diagnosis not present

## 2021-08-22 DIAGNOSIS — K219 Gastro-esophageal reflux disease without esophagitis: Secondary | ICD-10-CM | POA: Diagnosis not present

## 2021-08-22 DIAGNOSIS — R748 Abnormal levels of other serum enzymes: Secondary | ICD-10-CM | POA: Diagnosis not present

## 2021-08-22 DIAGNOSIS — E669 Obesity, unspecified: Secondary | ICD-10-CM | POA: Diagnosis not present

## 2021-08-22 DIAGNOSIS — Z6839 Body mass index (BMI) 39.0-39.9, adult: Secondary | ICD-10-CM | POA: Diagnosis not present

## 2021-08-25 DIAGNOSIS — M5416 Radiculopathy, lumbar region: Secondary | ICD-10-CM | POA: Diagnosis not present

## 2021-09-08 DIAGNOSIS — M25552 Pain in left hip: Secondary | ICD-10-CM | POA: Diagnosis not present

## 2021-09-08 DIAGNOSIS — M25561 Pain in right knee: Secondary | ICD-10-CM | POA: Diagnosis not present

## 2021-09-08 DIAGNOSIS — M545 Low back pain, unspecified: Secondary | ICD-10-CM | POA: Diagnosis not present

## 2021-09-08 DIAGNOSIS — Z96641 Presence of right artificial hip joint: Secondary | ICD-10-CM | POA: Diagnosis not present

## 2021-09-25 DIAGNOSIS — M5416 Radiculopathy, lumbar region: Secondary | ICD-10-CM | POA: Diagnosis not present

## 2021-10-03 DIAGNOSIS — M25512 Pain in left shoulder: Secondary | ICD-10-CM | POA: Diagnosis not present

## 2021-10-03 DIAGNOSIS — M7501 Adhesive capsulitis of right shoulder: Secondary | ICD-10-CM | POA: Diagnosis not present

## 2021-10-24 IMAGING — RF DG UGI W/ HIGH DENSITY W/O KUB
5 series · 14 of 24 positions shown · non-contrast
Comparison: None.

CLINICAL DATA: Six years status post sleeve gastrectomy. Recent
weight gain. Gastroesophageal reflux. Evaluate for hiatal hernia.

EXAM:
UPPER GI SERIES WITH KUB
TECHNIQUE: After obtaining a scout radiograph a routine upper GI series was
performed using thin and high density barium.
FLUOROSCOPY TIME:  Fluoroscopy Time:  4 minutes 24 seconds
Radiation Exposure Index (if provided by the fluoroscopic device):
69.3 mGy
Number of Acquired Spot Images: 0

[Series 1: one shot · 0.14mm/px · 5 of 12 slices shown (1 of 2)]
[im 1/12]
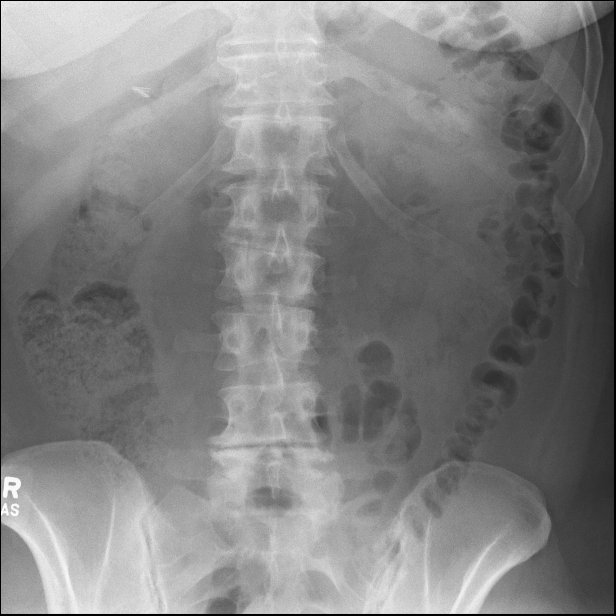
[im 4/12]
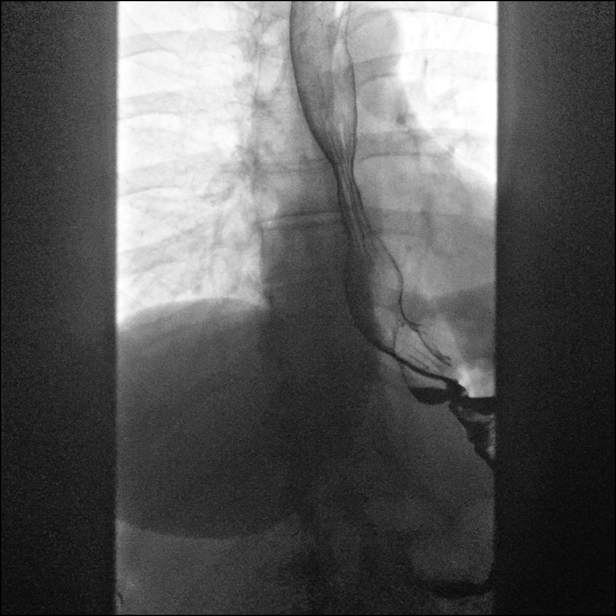
[im 7/12]
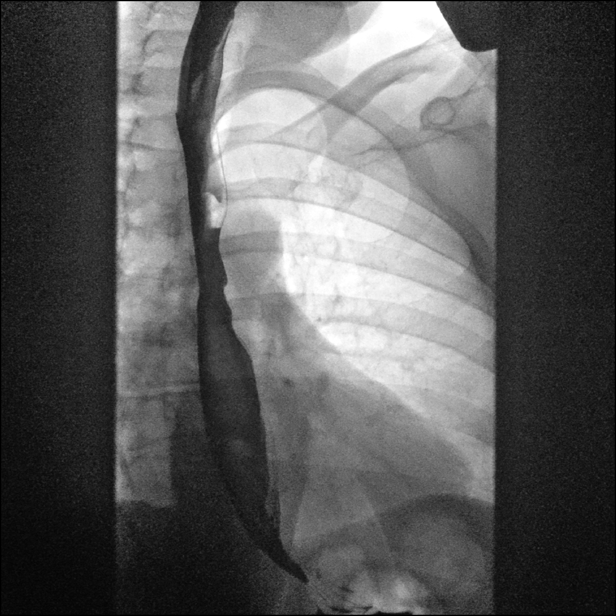
[im 9/12]
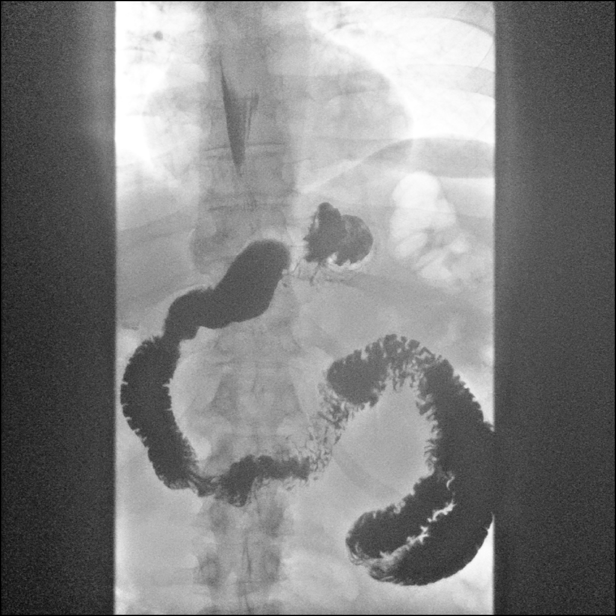
[im 12/12]
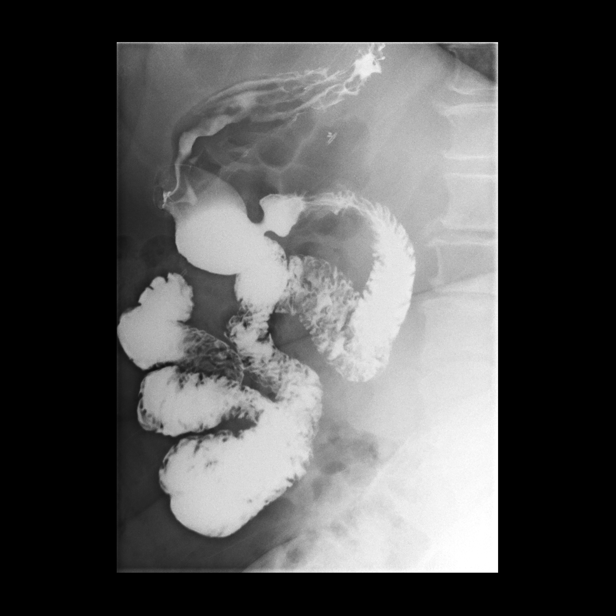

[Series 2: sequence · 1 of 52 frames shown (1 of 3)]
[frame 43/52]
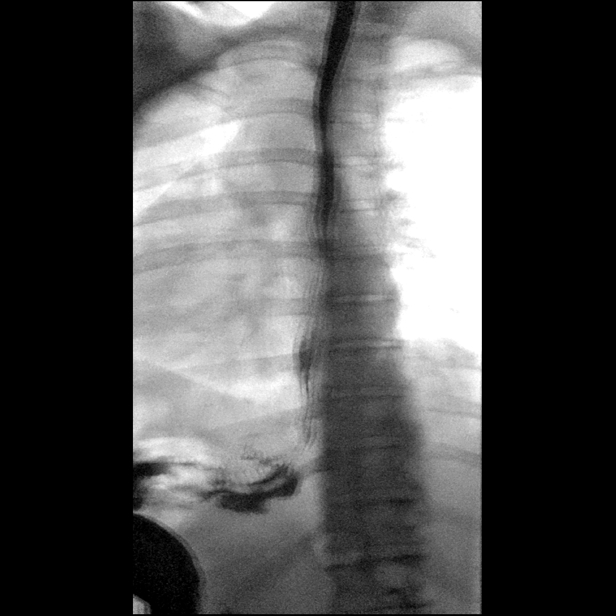

[Series 3: sequence · 2 of 93 frames shown (2 of 3)]
[frame 14/93]
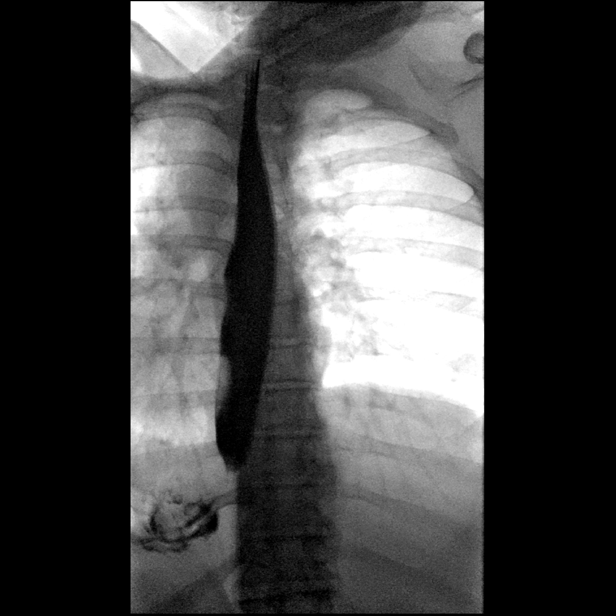
[frame 47/93]
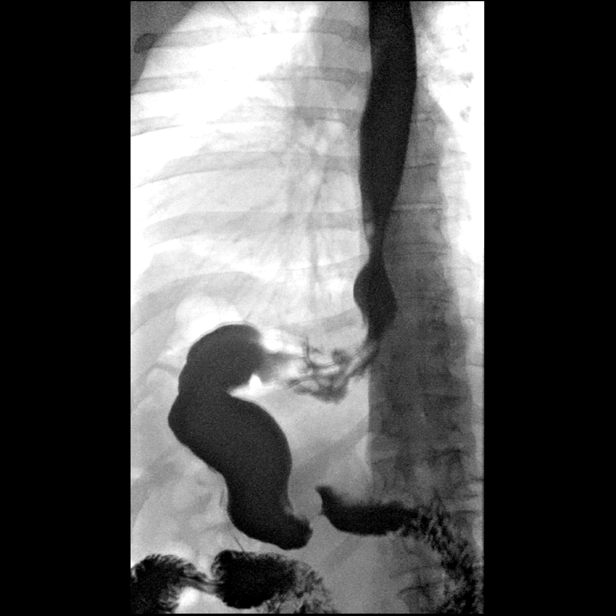

[Series 4: sequence · 2 of 75 frames shown (3 of 3)]
[frame 1/75]
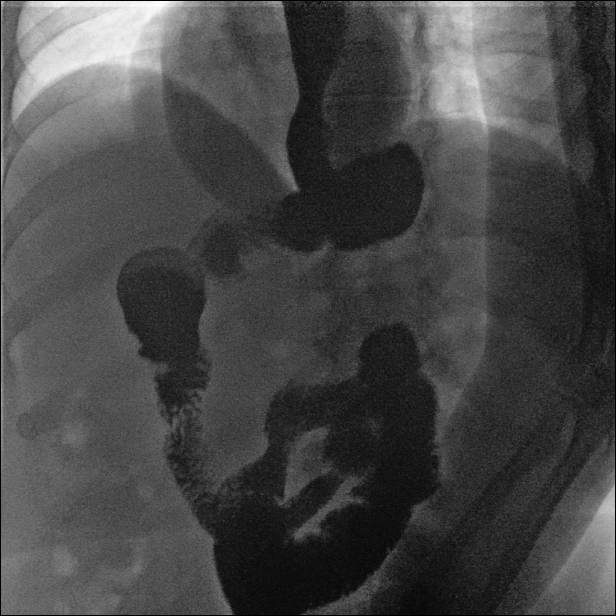
[frame 64/75]
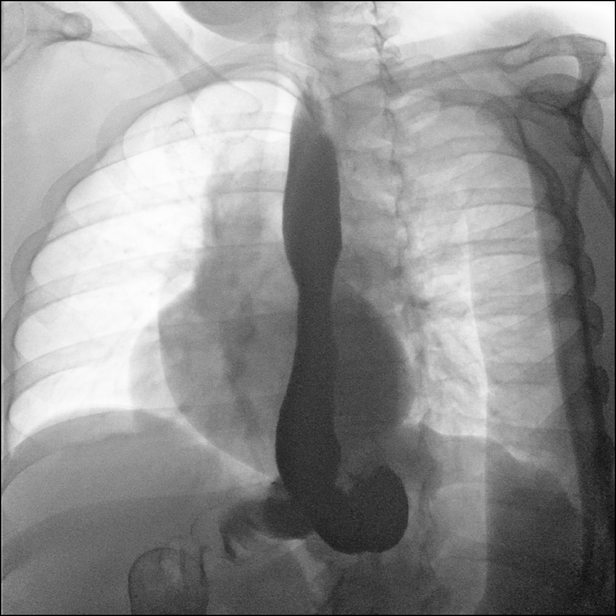

[Series 5: one shot · 4 of 10 slices shown (2 of 2)]
[im 3/10]
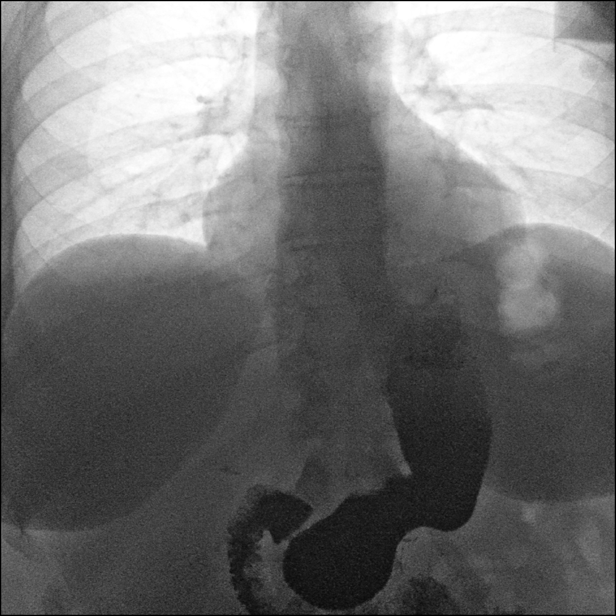
[im 4/10]
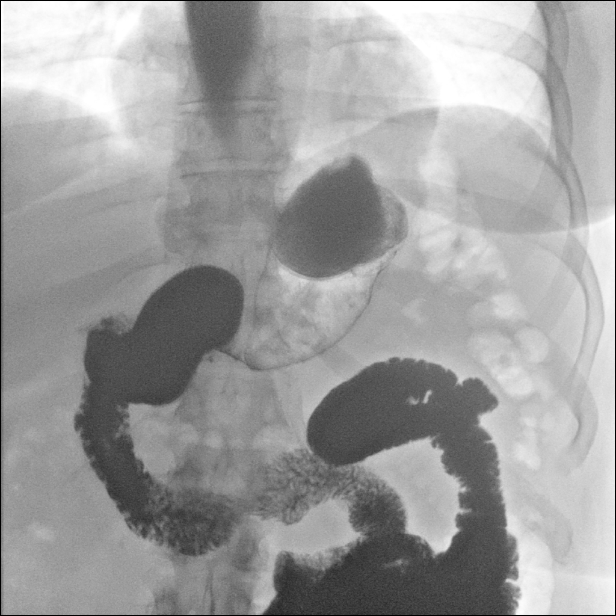
[im 7/10]
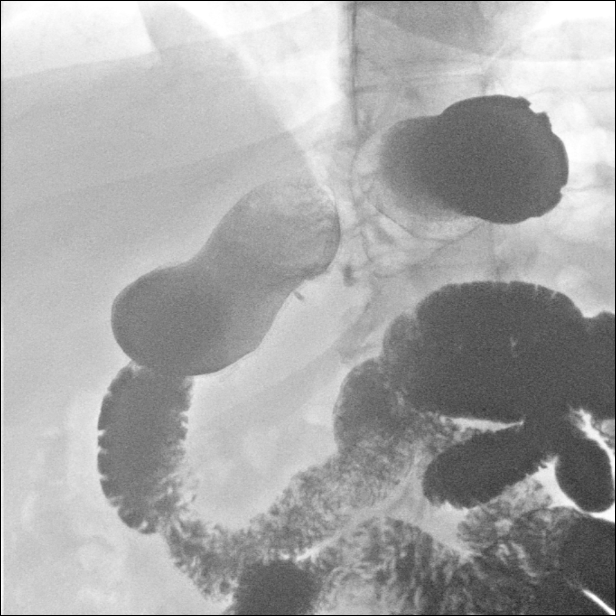
[im 10/10]
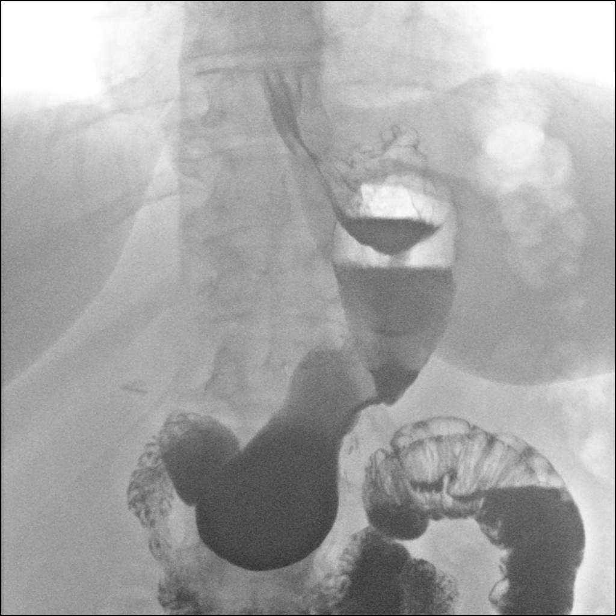

[14 of 24 positions shown; findings below may reference images not displayed]

FINDINGS: Scout radiograph: Unremarkable bowel gas pattern. Right upper
quadrant surgical clips noted from prior cholecystectomy.

Esophagus: No evidence of esophageal mass or stricture. No signs of
esophagitis noted. Motility is within normal limits. Severe
gastroesophageal reflux was demonstrated to the level of the
proximal thoracic esophagus.

Stomach: Expected postoperative appearance following sleeve
gastrectomy. No hiatal hernia visualized. No evidence of gastric
mass or ulcer. Prompt gastric emptying is seen.

Duodenum: Normal appearance of duodenal bulb and sweep. Normal
position of ligament of Treitz. No ulcer, stricture, or other
significant abnormality seen.

Other:  None.
IMPRESSION: Expected postop appearance of sleeve gastrectomy. No evidence of
hiatal hernia.

Severe gastroesophageal reflux. No evidence of esophageal stricture.

## 2021-10-31 DIAGNOSIS — M958 Other specified acquired deformities of musculoskeletal system: Secondary | ICD-10-CM | POA: Diagnosis not present

## 2021-10-31 DIAGNOSIS — M23321 Other meniscus derangements, posterior horn of medial meniscus, right knee: Secondary | ICD-10-CM | POA: Diagnosis not present

## 2021-10-31 DIAGNOSIS — M94261 Chondromalacia, right knee: Secondary | ICD-10-CM | POA: Diagnosis not present

## 2021-10-31 DIAGNOSIS — M23221 Derangement of posterior horn of medial meniscus due to old tear or injury, right knee: Secondary | ICD-10-CM | POA: Diagnosis not present

## 2021-10-31 DIAGNOSIS — G8918 Other acute postprocedural pain: Secondary | ICD-10-CM | POA: Diagnosis not present

## 2021-10-31 DIAGNOSIS — M2341 Loose body in knee, right knee: Secondary | ICD-10-CM | POA: Diagnosis not present

## 2021-11-16 DIAGNOSIS — M5416 Radiculopathy, lumbar region: Secondary | ICD-10-CM | POA: Diagnosis not present

## 2021-12-28 DIAGNOSIS — E569 Vitamin deficiency, unspecified: Secondary | ICD-10-CM | POA: Diagnosis not present

## 2021-12-28 DIAGNOSIS — Z13220 Encounter for screening for lipoid disorders: Secondary | ICD-10-CM | POA: Diagnosis not present

## 2021-12-28 DIAGNOSIS — K219 Gastro-esophageal reflux disease without esophagitis: Secondary | ICD-10-CM | POA: Diagnosis not present

## 2022-01-01 DIAGNOSIS — N390 Urinary tract infection, site not specified: Secondary | ICD-10-CM | POA: Diagnosis not present

## 2022-02-07 DIAGNOSIS — Z13220 Encounter for screening for lipoid disorders: Secondary | ICD-10-CM | POA: Diagnosis not present

## 2022-02-07 DIAGNOSIS — Z713 Dietary counseling and surveillance: Secondary | ICD-10-CM | POA: Diagnosis not present

## 2022-02-07 DIAGNOSIS — E569 Vitamin deficiency, unspecified: Secondary | ICD-10-CM | POA: Diagnosis not present

## 2022-02-07 DIAGNOSIS — Z6841 Body Mass Index (BMI) 40.0 and over, adult: Secondary | ICD-10-CM | POA: Diagnosis not present

## 2022-02-07 DIAGNOSIS — R635 Abnormal weight gain: Secondary | ICD-10-CM | POA: Diagnosis not present

## 2022-02-12 DIAGNOSIS — M47816 Spondylosis without myelopathy or radiculopathy, lumbar region: Secondary | ICD-10-CM | POA: Diagnosis not present

## 2022-02-12 DIAGNOSIS — M545 Low back pain, unspecified: Secondary | ICD-10-CM | POA: Diagnosis not present

## 2022-03-01 DIAGNOSIS — N390 Urinary tract infection, site not specified: Secondary | ICD-10-CM | POA: Diagnosis not present

## 2022-03-07 DIAGNOSIS — M7501 Adhesive capsulitis of right shoulder: Secondary | ICD-10-CM | POA: Diagnosis not present

## 2022-03-07 DIAGNOSIS — M25511 Pain in right shoulder: Secondary | ICD-10-CM | POA: Diagnosis not present

## 2022-03-07 DIAGNOSIS — M25512 Pain in left shoulder: Secondary | ICD-10-CM | POA: Diagnosis not present

## 2022-03-20 DIAGNOSIS — E039 Hypothyroidism, unspecified: Secondary | ICD-10-CM | POA: Diagnosis not present

## 2022-03-20 DIAGNOSIS — R748 Abnormal levels of other serum enzymes: Secondary | ICD-10-CM | POA: Diagnosis not present

## 2022-03-20 DIAGNOSIS — E669 Obesity, unspecified: Secondary | ICD-10-CM | POA: Diagnosis not present

## 2022-03-20 DIAGNOSIS — K219 Gastro-esophageal reflux disease without esophagitis: Secondary | ICD-10-CM | POA: Diagnosis not present

## 2022-04-06 ENCOUNTER — Telehealth: Payer: Self-pay | Admitting: Oncology

## 2022-04-06 NOTE — Telephone Encounter (Signed)
Scheduled appt per 8/3 referral. Pt is aware of appt date and time. Pt is aware to arrive 15 mins prior to appt time and to bring and updated insurance card. Pt is aware of appt location.   
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
## 2022-04-20 ENCOUNTER — Other Ambulatory Visit: Payer: Self-pay

## 2022-04-20 ENCOUNTER — Inpatient Hospital Stay (HOSPITAL_BASED_OUTPATIENT_CLINIC_OR_DEPARTMENT_OTHER): Payer: BC Managed Care – PPO | Admitting: Oncology

## 2022-04-20 ENCOUNTER — Inpatient Hospital Stay: Payer: BC Managed Care – PPO | Attending: Oncology

## 2022-04-20 DIAGNOSIS — K912 Postsurgical malabsorption, not elsewhere classified: Secondary | ICD-10-CM | POA: Insufficient documentation

## 2022-04-20 DIAGNOSIS — D500 Iron deficiency anemia secondary to blood loss (chronic): Secondary | ICD-10-CM | POA: Insufficient documentation

## 2022-04-20 DIAGNOSIS — Z9884 Bariatric surgery status: Secondary | ICD-10-CM | POA: Insufficient documentation

## 2022-04-20 DIAGNOSIS — Z885 Allergy status to narcotic agent status: Secondary | ICD-10-CM

## 2022-04-20 DIAGNOSIS — D508 Other iron deficiency anemias: Secondary | ICD-10-CM | POA: Insufficient documentation

## 2022-04-20 DIAGNOSIS — K5900 Constipation, unspecified: Secondary | ICD-10-CM | POA: Insufficient documentation

## 2022-04-20 DIAGNOSIS — Z9049 Acquired absence of other specified parts of digestive tract: Secondary | ICD-10-CM

## 2022-04-20 DIAGNOSIS — Z888 Allergy status to other drugs, medicaments and biological substances status: Secondary | ICD-10-CM | POA: Diagnosis not present

## 2022-04-20 DIAGNOSIS — R5383 Other fatigue: Secondary | ICD-10-CM

## 2022-04-20 DIAGNOSIS — M1990 Unspecified osteoarthritis, unspecified site: Secondary | ICD-10-CM | POA: Insufficient documentation

## 2022-04-20 DIAGNOSIS — Z79890 Hormone replacement therapy (postmenopausal): Secondary | ICD-10-CM

## 2022-04-20 DIAGNOSIS — Z903 Acquired absence of stomach [part of]: Secondary | ICD-10-CM

## 2022-04-20 DIAGNOSIS — K219 Gastro-esophageal reflux disease without esophagitis: Secondary | ICD-10-CM | POA: Insufficient documentation

## 2022-04-20 LAB — CBC WITH DIFFERENTIAL/PLATELET
Abs Immature Granulocytes: 0.02 10*3/uL (ref 0.00–0.07)
Basophils Absolute: 0 10*3/uL (ref 0.0–0.1)
Basophils Relative: 1 %
Eosinophils Absolute: 0.2 10*3/uL (ref 0.0–0.5)
Eosinophils Relative: 3 %
HCT: 42.3 % (ref 36.0–46.0)
Hemoglobin: 14.6 g/dL (ref 12.0–15.0)
Immature Granulocytes: 0 %
Lymphocytes Relative: 23 %
Lymphs Abs: 1.5 10*3/uL (ref 0.7–4.0)
MCH: 32.4 pg (ref 26.0–34.0)
MCHC: 34.5 g/dL (ref 30.0–36.0)
MCV: 93.8 fL (ref 80.0–100.0)
Monocytes Absolute: 0.5 10*3/uL (ref 0.1–1.0)
Monocytes Relative: 9 %
Neutro Abs: 4.1 10*3/uL (ref 1.7–7.7)
Neutrophils Relative %: 64 %
Platelets: 193 10*3/uL (ref 150–400)
RBC: 4.51 MIL/uL (ref 3.87–5.11)
RDW: 12.3 % (ref 11.5–15.5)
WBC: 6.3 10*3/uL (ref 4.0–10.5)
nRBC: 0 % (ref 0.0–0.2)

## 2022-04-20 LAB — CMP (CANCER CENTER ONLY)
ALT: 17 U/L (ref 0–44)
AST: 17 U/L (ref 15–41)
Albumin: 4.3 g/dL (ref 3.5–5.0)
Alkaline Phosphatase: 73 U/L (ref 38–126)
Anion gap: 3 — ABNORMAL LOW (ref 5–15)
BUN: 26 mg/dL — ABNORMAL HIGH (ref 6–20)
CO2: 30 mmol/L (ref 22–32)
Calcium: 9.5 mg/dL (ref 8.9–10.3)
Chloride: 106 mmol/L (ref 98–111)
Creatinine: 0.71 mg/dL (ref 0.44–1.00)
GFR, Estimated: >60 mL/min (ref >60)
Glucose, Bld: 85 mg/dL (ref 70–99)
Potassium: 4.3 mmol/L (ref 3.5–5.1)
Sodium: 139 mmol/L (ref 135–145)
Total Bilirubin: 0.8 mg/dL (ref 0.3–1.2)
Total Protein: 7 g/dL (ref 6.5–8.1)

## 2022-04-20 LAB — FERRITIN: Ferritin: 25 ng/mL (ref 11–307)

## 2022-04-20 LAB — VITAMIN B12: Vitamin B-12: 905 pg/mL (ref 180–914)

## 2022-04-20 LAB — FOLATE: Folate: 4.5 ng/mL — ABNORMAL LOW (ref >5.9)

## 2022-04-20 LAB — RETICULOCYTES
Immature Retic Fract: 12.9 % (ref 2.3–15.9)
RBC.: 4.42 MIL/uL (ref 3.87–5.11)
Retic Count, Absolute: 80.4 10*3/uL (ref 19.0–186.0)
Retic Ct Pct: 1.8 % (ref 0.4–3.1)

## 2022-04-20 LAB — VITAMIN D 25 HYDROXY (VIT D DEFICIENCY, FRACTURES): Vit D, 25-Hydroxy: 25.18 ng/mL — ABNORMAL LOW (ref 30–100)

## 2022-04-20 NOTE — Patient Instructions (Signed)
Please begin Iron sulfate 25 mg daily or consider using Solgar gentle iron daily instead of taking Iron sulfate 325 mg daily.

## 2022-04-22 LAB — IGA: IgA: 245 mg/dL (ref 87–352)

## 2022-04-23 ENCOUNTER — Telehealth: Payer: Self-pay | Admitting: Oncology

## 2022-04-23 NOTE — Telephone Encounter (Signed)
Scheduled appt per 8/18 los. Pt is aware.

## 2022-04-24 LAB — VITAMIN E
Vitamin E (Alpha Tocopherol): 18.8 mg/L (ref 7.0–25.1)
Vitamin E(Gamma Tocopherol): 1.8 mg/L (ref 0.5–5.5)

## 2022-04-24 LAB — VITAMIN A: Vitamin A (Retinoic Acid): 52 ug/dL (ref 20.1–62.0)

## 2022-04-25 LAB — COPPER, SERUM: Copper: 100 ug/dL (ref 80–158)

## 2022-04-25 LAB — ZINC: Zinc: 58 ug/dL (ref 44–115)

## 2022-04-26 LAB — VITAMIN K1, SERUM: VITAMIN K1: 1.27 ng/mL (ref 0.10–2.20)

## 2022-05-03 ENCOUNTER — Encounter: Payer: Self-pay | Admitting: Oncology

## 2022-05-03 DIAGNOSIS — E039 Hypothyroidism, unspecified: Secondary | ICD-10-CM | POA: Diagnosis not present

## 2022-05-08 DIAGNOSIS — E039 Hypothyroidism, unspecified: Secondary | ICD-10-CM | POA: Diagnosis not present

## 2022-05-08 DIAGNOSIS — E559 Vitamin D deficiency, unspecified: Secondary | ICD-10-CM | POA: Diagnosis not present

## 2022-05-08 DIAGNOSIS — R748 Abnormal levels of other serum enzymes: Secondary | ICD-10-CM | POA: Diagnosis not present

## 2022-05-09 DIAGNOSIS — E039 Hypothyroidism, unspecified: Secondary | ICD-10-CM | POA: Diagnosis not present

## 2022-05-09 DIAGNOSIS — Z0000 Encounter for general adult medical examination without abnormal findings: Secondary | ICD-10-CM | POA: Diagnosis not present

## 2022-05-09 DIAGNOSIS — K219 Gastro-esophageal reflux disease without esophagitis: Secondary | ICD-10-CM | POA: Diagnosis not present

## 2022-05-09 DIAGNOSIS — E785 Hyperlipidemia, unspecified: Secondary | ICD-10-CM | POA: Diagnosis not present
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
## 2022-05-17 DIAGNOSIS — R300 Dysuria: Secondary | ICD-10-CM | POA: Diagnosis not present

## 2022-05-18 ENCOUNTER — Inpatient Hospital Stay: Payer: BC Managed Care – PPO | Admitting: Oncology
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
## 2022-06-08 ENCOUNTER — Inpatient Hospital Stay: Payer: BC Managed Care – PPO | Admitting: Oncology

## 2022-06-15 ENCOUNTER — Telehealth: Payer: Self-pay

## 2022-06-15 NOTE — Telephone Encounter (Signed)
This nurse attempted to reach patient related to lab results and provider recommendations.  This nurse will send message via active My Chart.  No further concerns.

## 2022-06-20 DIAGNOSIS — J300 Vasomotor rhinitis: Secondary | ICD-10-CM | POA: Diagnosis not present

## 2022-06-20 DIAGNOSIS — T781XXA Other adverse food reactions, not elsewhere classified, initial encounter: Secondary | ICD-10-CM | POA: Diagnosis not present
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
## 2022-06-22 ENCOUNTER — Inpatient Hospital Stay: Payer: BC Managed Care – PPO | Admitting: Oncology

## 2022-06-29 DIAGNOSIS — T781XXA Other adverse food reactions, not elsewhere classified, initial encounter: Secondary | ICD-10-CM | POA: Diagnosis not present

## 2022-07-12 DIAGNOSIS — Z1231 Encounter for screening mammogram for malignant neoplasm of breast: Secondary | ICD-10-CM | POA: Diagnosis not present

## 2022-07-12 DIAGNOSIS — Z6841 Body Mass Index (BMI) 40.0 and over, adult: Secondary | ICD-10-CM | POA: Diagnosis not present

## 2022-07-12 DIAGNOSIS — Z1151 Encounter for screening for human papillomavirus (HPV): Secondary | ICD-10-CM | POA: Diagnosis not present

## 2022-07-12 DIAGNOSIS — Z124 Encounter for screening for malignant neoplasm of cervix: Secondary | ICD-10-CM | POA: Diagnosis not present

## 2022-07-12 DIAGNOSIS — Z01419 Encounter for gynecological examination (general) (routine) without abnormal findings: Secondary | ICD-10-CM | POA: Diagnosis not present

## 2022-08-01 DIAGNOSIS — M79672 Pain in left foot: Secondary | ICD-10-CM | POA: Diagnosis not present

## 2022-08-13 DIAGNOSIS — Z1211 Encounter for screening for malignant neoplasm of colon: Secondary | ICD-10-CM | POA: Diagnosis not present

## 2022-08-13 DIAGNOSIS — K635 Polyp of colon: Secondary | ICD-10-CM | POA: Diagnosis not present

## 2022-08-13 DIAGNOSIS — K5730 Diverticulosis of large intestine without perforation or abscess without bleeding: Secondary | ICD-10-CM | POA: Diagnosis not present

## 2022-08-13 DIAGNOSIS — K648 Other hemorrhoids: Secondary | ICD-10-CM | POA: Diagnosis not present

## 2022-08-14 ENCOUNTER — Encounter: Payer: Self-pay | Admitting: Podiatry

## 2022-08-22 ENCOUNTER — Ambulatory Visit: Payer: PRIVATE HEALTH INSURANCE | Admitting: Podiatry

## 2022-08-23 DIAGNOSIS — M25512 Pain in left shoulder: Secondary | ICD-10-CM | POA: Diagnosis not present

## 2022-08-23 DIAGNOSIS — M7501 Adhesive capsulitis of right shoulder: Secondary | ICD-10-CM | POA: Diagnosis not present

## 2022-08-28 DIAGNOSIS — E785 Hyperlipidemia, unspecified: Secondary | ICD-10-CM | POA: Diagnosis not present

## 2022-08-28 DIAGNOSIS — Z9884 Bariatric surgery status: Secondary | ICD-10-CM | POA: Diagnosis not present

## 2022-08-28 DIAGNOSIS — Z131 Encounter for screening for diabetes mellitus: Secondary | ICD-10-CM | POA: Diagnosis not present

## 2022-08-28 DIAGNOSIS — K219 Gastro-esophageal reflux disease without esophagitis: Secondary | ICD-10-CM | POA: Diagnosis not present

## 2022-08-29 ENCOUNTER — Ambulatory Visit: Payer: PRIVATE HEALTH INSURANCE | Admitting: Podiatry

## 2022-09-24 ENCOUNTER — Ambulatory Visit: Payer: BC Managed Care – PPO | Admitting: Podiatry

## 2022-09-24 ENCOUNTER — Ambulatory Visit (INDEPENDENT_AMBULATORY_CARE_PROVIDER_SITE_OTHER): Payer: BC Managed Care – PPO

## 2022-09-24 DIAGNOSIS — M778 Other enthesopathies, not elsewhere classified: Secondary | ICD-10-CM | POA: Diagnosis not present

## 2022-09-24 DIAGNOSIS — M79672 Pain in left foot: Secondary | ICD-10-CM
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
## 2022-10-01 ENCOUNTER — Other Ambulatory Visit: Payer: Self-pay | Admitting: Podiatry

## 2022-10-01 DIAGNOSIS — M79672 Pain in left foot: Secondary | ICD-10-CM

## 2022-10-01 DIAGNOSIS — M778 Other enthesopathies, not elsewhere classified: Secondary | ICD-10-CM

## 2023-10-15 ENCOUNTER — Encounter (HOSPITAL_COMMUNITY): Payer: Self-pay

## 2023-10-15 ENCOUNTER — Other Ambulatory Visit (HOSPITAL_COMMUNITY): Payer: Self-pay

## 2023-10-15 DIAGNOSIS — E039 Hypothyroidism, unspecified: Secondary | ICD-10-CM | POA: Diagnosis not present

## 2023-10-15 DIAGNOSIS — Z9884 Bariatric surgery status: Secondary | ICD-10-CM | POA: Diagnosis not present

## 2023-10-15 DIAGNOSIS — M25511 Pain in right shoulder: Secondary | ICD-10-CM | POA: Diagnosis not present

## 2023-10-15 DIAGNOSIS — K219 Gastro-esophageal reflux disease without esophagitis: Secondary | ICD-10-CM | POA: Diagnosis not present

## 2023-10-15 DIAGNOSIS — B009 Herpesviral infection, unspecified: Secondary | ICD-10-CM | POA: Diagnosis not present

## 2023-10-15 MED ORDER — ESOMEPRAZOLE MAGNESIUM 40 MG PO CPDR
40.0000 mg | DELAYED_RELEASE_CAPSULE | Freq: Every day | ORAL | 5 refills | Status: AC
  Filled 2023-10-15: qty 90, 90d supply, fill #0

## 2023-10-15 MED ORDER — VALACYCLOVIR HCL 1 G PO TABS
1.0000 g | ORAL_TABLET | Freq: Two times a day (BID) | ORAL | 0 refills | Status: AC
  Filled 2023-10-15: qty 14, 7d supply, fill #0

## 2023-10-15 MED ORDER — ACYCLOVIR 5 % EX OINT
1.0000 | TOPICAL_OINTMENT | Freq: Every day | CUTANEOUS | 5 refills | Status: AC
Start: 2023-10-15 — End: ?
  Filled 2023-10-15: qty 15, 30d supply, fill #0

## 2023-10-15 MED ORDER — DICLOFENAC SODIUM 75 MG PO TBEC
75.0000 mg | DELAYED_RELEASE_TABLET | Freq: Two times a day (BID) | ORAL | 5 refills | Status: AC
Start: 2023-10-15 — End: ?
  Filled 2023-10-15: qty 180, 90d supply, fill #0

## 2023-10-15 MED ORDER — LEVOTHYROXINE SODIUM 112 MCG PO TABS
112.0000 ug | ORAL_TABLET | Freq: Every morning | ORAL | 5 refills | Status: AC
  Filled 2023-10-15: qty 90, 90d supply, fill #0

## 2023-10-16 ENCOUNTER — Other Ambulatory Visit (HOSPITAL_COMMUNITY): Payer: Self-pay

## 2023-10-18 ENCOUNTER — Other Ambulatory Visit (HOSPITAL_COMMUNITY): Payer: Self-pay

## 2023-10-21 ENCOUNTER — Ambulatory Visit: Payer: PRIVATE HEALTH INSURANCE | Admitting: Skilled Nursing Facility1

## 2023-10-21 ENCOUNTER — Encounter: Payer: Self-pay | Admitting: Skilled Nursing Facility1

## 2023-10-21 ENCOUNTER — Encounter: Payer: PRIVATE HEALTH INSURANCE | Attending: Physician Assistant | Admitting: Skilled Nursing Facility1

## 2023-10-21 VITALS — Ht 68.0 in | Wt 228.2 lb

## 2023-10-21 DIAGNOSIS — E631 Imbalance of constituents of food intake: Secondary | ICD-10-CM | POA: Insufficient documentation

## 2023-10-21 NOTE — Progress Notes (Unsigned)
Medical Nutrition Therapy  Appointment Start time:  4:55  Appointment End time:  5:45  Primary concerns today: follow up from duodenal switch  Referral diagnosis: [employer] employee   NUTRITION ASSESSMENT    Clinical Medical Hx: sleeve gastrectomy 2015 Medications: see list Labs: none updated since surgery  Notable Signs/Symptoms: none reported   Lifestyle & Dietary Hx  Pt states she is allergic to tree nuts.  Pt states she just got the duodenal switch November 2025. Pt states she continues with acid reflux. Pt states she has been taking stool softener daily and MiraLax every other day.  Pt states she plays cards with her friends once a week.   Estimated daily fluid intake:  oz Supplements: calcium 500 mg 4 daily, pro-care multi 1 daily chewable, biotin 1000 mg  Sleep: 6 hours not feeling rested initially  Stress / self-care: pt stated low  Current average weekly physical activity: ADL's   24-Hr Dietary Recall First Meal: protein shake  Snack: boiled egg + lunch meat Second Meal: chicken or healthy choice meal Snack: skipped Third Meal: chicken + cauliflower rice or broccoli and cheese  Snack: jello or greek yogurt Beverages: water, sugar free drink     NUTRITION INTERVENTION  Nutrition education (E-1) on the following topics:  A balanced diet typically includes a variety of foods from different food groups, providing essential nutrients such as carbohydrates, proteins, fats, vitamins, and minerals. Tips for maintaining a balanced eating plan: Focus on protein: Prioritize lean protein sources to support muscle preservation. Include a variety of fruits and vegetables: Aim to consume a colorful array of fruits and vegetables, as they offer different vitamins, minerals, and antioxidants. Choose whole grains: Opt for whole grains such as brown rice, quinoa, and whole wheat, as they contain more nutrients and fiber compared to refined grains. Incorporate lean proteins: Include  sources of lean protein in your diet, such as poultry, fish, beans, legumes, tofu, and low-fat dairy products. Include healthy fats: Choose sources of healthy fats, such as avocados, nuts, seeds, and olive oil. Limit saturated and trans fats found in processed and fried foods. Monitor portion sizes: Be mindful of portion sizes to avoid over or under eating. Eating in moderation is important for maintaining your health goals Stay hydrated: Drink at least 64 ounces of water throughout the day. Water is essential for various bodily functions and can also help control hunger. Stay well-hydrated by sipping water throughout the day. Limit added sugars and salt: Minimize the consumption of foods and beverages high in added sugars and sodium. Check nutrition labels to make informed choices. Plan meals and snacks: Planning meals in advance can help ensure that you have a balanced mix of nutrients throughout the day. Be mindful when eating: Small, frequent meals: Consume small portions and eat slowly to avoid discomfort. Be present and engaged with your meal. Avoid distractions like television or electronic devices. Focus on the act of eating and the sensory experience of the food. This will help you with any emotional eating such as eating out of boredom  By practicing mindful eating and listening to your body's cues, you can foster a healthier relationship with food, promote better digestion, and support overall well-being.  Handouts Provided Include  Post bariatric surgery handout   Learning Style & Readiness for Change Teaching method utilized: Visual & Auditory  Demonstrated degree of understanding via: Teach Back  Barriers to learning/adherence to lifestyle change: emotional eating   Goals Established by Pt I will keep a drink  with me at all times to ensure I drink throughout the day; keep a drink next to me at bed time    MONITORING & EVALUATION Dietary intake, weekly physical activity  Next  Steps  Patient is to call or email with any future questions or concerns as she doe snot desire futher follow up at this time.

## 2023-11-08 DIAGNOSIS — Z9884 Bariatric surgery status: Secondary | ICD-10-CM | POA: Diagnosis not present

## 2023-11-08 DIAGNOSIS — E039 Hypothyroidism, unspecified: Secondary | ICD-10-CM | POA: Diagnosis not present

## 2023-11-11 ENCOUNTER — Other Ambulatory Visit: Payer: Self-pay

## 2023-11-11 ENCOUNTER — Other Ambulatory Visit (HOSPITAL_COMMUNITY): Payer: Self-pay

## 2023-11-11 MED ORDER — LEVOTHYROXINE SODIUM 125 MCG PO TABS
125.0000 ug | ORAL_TABLET | Freq: Every morning | ORAL | 3 refills | Status: DC
  Filled 2023-11-11: qty 30, 30d supply, fill #0

## 2023-11-11 MED ORDER — LEVOTHYROXINE SODIUM 125 MCG PO TABS
125.0000 ug | ORAL_TABLET | Freq: Every morning | ORAL | 0 refills | Status: DC
  Filled 2023-11-11: qty 30, 30d supply, fill #0

## 2023-12-07 ENCOUNTER — Other Ambulatory Visit (HOSPITAL_COMMUNITY): Payer: Self-pay

## 2023-12-09 ENCOUNTER — Other Ambulatory Visit (HOSPITAL_COMMUNITY): Payer: Self-pay

## 2023-12-09 MED ORDER — LEVOTHYROXINE SODIUM 125 MCG PO TABS
125.0000 ug | ORAL_TABLET | Freq: Every morning | ORAL | 6 refills | Status: AC
Start: 2023-12-09 — End: ?
  Filled 2023-12-09: qty 30, 30d supply, fill #0

## 2023-12-27 DIAGNOSIS — Z9884 Bariatric surgery status: Secondary | ICD-10-CM | POA: Diagnosis not present

## 2023-12-27 DIAGNOSIS — E039 Hypothyroidism, unspecified: Secondary | ICD-10-CM | POA: Diagnosis not present

## 2024-08-21 ENCOUNTER — Other Ambulatory Visit: Payer: Self-pay
